# Patient Record
Sex: Female | Born: 1980 | Race: Black or African American | Hispanic: No | Marital: Single | Smoking: Former smoker
Health system: Southern US, Community
[De-identification: ages and names within clinical notes are randomized; demographics above are authoritative.]

## PROBLEM LIST (undated history)

## (undated) DIAGNOSIS — E669 Obesity, unspecified: Secondary | ICD-10-CM

## (undated) DIAGNOSIS — D481 Neoplasm of uncertain behavior of connective and other soft tissue: Secondary | ICD-10-CM

## (undated) DIAGNOSIS — D649 Anemia, unspecified: Secondary | ICD-10-CM

## (undated) DIAGNOSIS — I10 Essential (primary) hypertension: Secondary | ICD-10-CM

## (undated) DIAGNOSIS — D071 Carcinoma in situ of vulva: Secondary | ICD-10-CM

## (undated) DIAGNOSIS — D48119 Desmoid tumor of unspecified site: Secondary | ICD-10-CM

## (undated) HISTORY — PX: TUBAL LIGATION: SHX77

## (undated) HISTORY — DX: Neoplasm of uncertain behavior of connective and other soft tissue: D48.1

## (undated) HISTORY — DX: Obesity, unspecified: E66.9

## (undated) HISTORY — DX: Carcinoma in situ of vulva: D07.1

## (undated) HISTORY — DX: Essential (primary) hypertension: I10

## (undated) HISTORY — DX: Desmoid tumor of unspecified site: D48.119

---

## 2012-05-13 HISTORY — PX: SOFT TISSUE TUMOR RESECTION: SHX1054

## 2017-10-09 ENCOUNTER — Emergency Department (HOSPITAL_COMMUNITY)
Admission: EM | Admit: 2017-10-09 | Discharge: 2017-10-09 | Disposition: A | Discharge status: Home / Self Care | Payer: Medicaid Other | Attending: Emergency Medicine | Admitting: Emergency Medicine

## 2017-10-09 ENCOUNTER — Emergency Department (HOSPITAL_COMMUNITY): Payer: Medicaid Other

## 2017-10-09 ENCOUNTER — Ambulatory Visit (HOSPITAL_COMMUNITY): Admission: RE | Admit: 2017-10-09 | Payer: Medicaid Other | Source: Ambulatory Visit

## 2017-10-09 DIAGNOSIS — Y9301 Activity, walking, marching and hiking: Secondary | ICD-10-CM | POA: Insufficient documentation

## 2017-10-09 DIAGNOSIS — X509XXA Other and unspecified overexertion or strenuous movements or postures, initial encounter: Secondary | ICD-10-CM | POA: Insufficient documentation

## 2017-10-09 DIAGNOSIS — S99921A Unspecified injury of right foot, initial encounter: Secondary | ICD-10-CM | POA: Diagnosis present

## 2017-10-09 DIAGNOSIS — Y9289 Other specified places as the place of occurrence of the external cause: Secondary | ICD-10-CM | POA: Insufficient documentation

## 2017-10-09 DIAGNOSIS — S92351A Displaced fracture of fifth metatarsal bone, right foot, initial encounter for closed fracture: Secondary | ICD-10-CM | POA: Diagnosis not present

## 2017-10-09 DIAGNOSIS — Y998 Other external cause status: Secondary | ICD-10-CM | POA: Insufficient documentation

## 2017-10-09 MED ORDER — MELOXICAM 15 MG PO TABS: 15.0000 mg | ORAL_TABLET | Freq: Every day | ORAL | 0 refills | Status: DC

## 2017-10-09 NOTE — ED Provider Notes (Signed)
[facility] DEPT Provider Note   CSN: [id number] Arrival date & time: 10/09/17  1047     History   Chief Complaint Chief Complaint  Patient presents with  . Foot Pain    HPI Tamara Rhodes is a 37 y.o. female.   Tamara Rhodes is a 37 y.o. female who sustained a right foot injury 3 hour(s) ago. Mechanism of injury: Patient stepped wrong coming off of a step. Immediate symptoms: immediate pain, immediate swelling, was able to bear weight directly after injury, however heel walking on the R. Symptoms have been constant since that time. Prior history of related problems: no prior problems with this area in the past. .   The history is provided by the patient.  Foot Injury   The incident occurred 3 to 5 hours ago. The incident occurred at home. The injury mechanism was a fall. The pain is present in the right foot and right ankle. The quality of the pain is described as throbbing and aching. The pain is at a severity of 9/10. The pain is moderate. The pain has been constant since onset. Pertinent negatives include no numbness, no inability to bear weight, no loss of motion, no muscle weakness, no loss of sensation and no tingling. She reports no foreign bodies present. The symptoms are aggravated by bearing weight. She has tried nothing for the symptoms.    No past medical history on file.  There are no active problems to display for this patient.    The histories are not reviewed yet. Please review them in the "History" navigator section and refresh this [facility].   OB History   None      Home Medications    Prior to Admission medications   Medication Sig Start Date End Date Taking? Authorizing Provider  meloxicam (MOBIC) 15 MG tablet Take 1 tablet (15 mg total) by mouth daily. Take 1 daily with food. 10/09/17   Margarita Mail, PA-C    Family History No family history on file.  Social History Social History   Tobacco Use  . Smoking  status: Not on file  Substance Use Topics  . Alcohol use: Not on file  . Drug use: Not on file     Allergies   Patient has no allergy information on record.   Review of Systems Review of Systems  Musculoskeletal: Positive for gait problem and joint swelling.  Skin: Negative for wound.  Neurological: Negative for tingling, weakness and numbness.     Physical Exam Updated Vital Signs BP (!) 133/109 (BP Location: Left Arm)   Pulse 99   Temp 98.1 F (36.7 C) (Oral)   Resp 16   Ht 5' (1.524 m)   Wt 107 kg (236 lb)   SpO2 100%   BMI 46.09 kg/m   Physical Exam  Constitutional: She is oriented to person, place, and time. She appears well-developed and well-nourished. No distress.  HENT:  Head: Normocephalic and atraumatic.  Eyes: Conjunctivae are normal. No scleral icterus.  Neck: Normal range of motion.  Cardiovascular: Normal rate, regular rhythm and normal heart sounds. Exam reveals no gallop and no friction rub.  No murmur heard. Pulmonary/Chest: Effort normal and breath sounds normal. No respiratory distress.  Abdominal: Soft. Bowel sounds are normal. She exhibits no distension and no mass. There is no tenderness. There is no guarding.  Musculoskeletal:  R foot with swelling and brusing over the 4t/5th distal metatarsals of the R foot. Normal ipsilateral ankle and knee exam. NVI  Neurological: She is alert and oriented to person, place, and time.  Skin: Skin is warm and dry. She is not diaphoretic.  Psychiatric: Her behavior is normal.  Nursing note and vitals reviewed.    ED Treatments / Results  Labs (all labs ordered are listed, but only abnormal results are displayed) Labs Reviewed - No data to display  EKG None  Radiology Dg Foot Complete Right  Result Date: 10/09/2017 CLINICAL DATA:  37 year old female with 5th metatarsal region pain after slip and fall down stairs today. EXAM: RIGHT FOOT COMPLETE - 3+ VIEW COMPARISON:  None. FINDINGS: Spiral  extra-articular fracture of the distal right 5th metatarsal. 1/2 shaft width medial displacement and overriding of 4-5 millimeters. The 5th MTP joint appears normal. The 5th phalanges are intact. The other metatarsals appear intact; there is substantial foreshortening of the 4th metatarsal. No other fracture or dislocation identified. Joint spaces are within normal limits. Calcaneus intact. IMPRESSION: Extra-articular spiral fracture of the distal right 5th metatarsal with mild medial displacement and overriding. Electronically Signed   By: Genevie Ann M.D.   On: 10/09/2017 11:36    Procedures Procedures (including critical care time)  Medications Ordered in ED Medications - No data to display   Initial Impression / Assessment and Plan / ED Course  I have reviewed the triage vital signs and the nursing notes.  Pertinent labs & imaging results that were available during my care of the patient were reviewed by me and considered in my medical decision making (see chart for details).     Patient with a closed displaced spiral fracture of the distal fifth metatarsal.  I reviewed the images with Dr. Zollie Beckers who states that she can follow-up in his office next week, weightbearing as tolerated with a cam walker.  Informed of diagnosis and follow-up plan.  She appears appropriate for discharge at this time.  Final Clinical Impressions(s) / ED Diagnoses   Final diagnoses:  Closed displaced fracture of fifth metatarsal bone of right foot, initial encounter    ED Discharge Orders        Ordered    meloxicam (MOBIC) 15 MG tablet  Daily     10/09/17 1355       Margarita Mail, PA-C 10/09/17 1357    Tanna Furry, MD 10/11/17 [phone]

## 2017-10-09 NOTE — Discharge Instructions (Signed)
Contact a health care provider if: You have a fever. Your cast, splint, or boot is too loose or too tight. Your cast, splint, or boot is damaged. Your pain medicine is not helping. You have pain, tingling, or numbness in your foot that is not going away. Get help right away if: You have severe pain. You have tingling or numbness in your foot that is getting worse. Your foot feels cold or becomes numb. Your foot changes color.

## 2017-10-09 NOTE — ED Triage Notes (Signed)
Pt tripped and fell this morning, injuring right foot. Pt complains pt complains of pain on top of foot and along 4th and 5th toes.

## 2017-10-14 ENCOUNTER — Ambulatory Visit (INDEPENDENT_AMBULATORY_CARE_PROVIDER_SITE_OTHER): Payer: Medicaid Other | Admitting: Orthopaedic Surgery

## 2017-10-14 DIAGNOSIS — S92351A Displaced fracture of fifth metatarsal bone, right foot, initial encounter for closed fracture: Secondary | ICD-10-CM

## 2017-10-14 MED ORDER — IBUPROFEN 800 MG PO TABS: 800.0000 mg | ORAL_TABLET | Freq: Two times a day (BID) | ORAL | 0 refills | Status: DC | PRN

## 2017-10-14 MED ORDER — VITAMIN D (ERGOCALCIFEROL) 1.25 MG (50000 UNIT) PO CAPS: 50000.0000 [IU] | ORAL_CAPSULE | ORAL | 0 refills | Status: DC

## 2017-10-14 NOTE — Progress Notes (Signed)
   Office Visit Note   Patient: Tamara Rhodes           Date of Birth: 03-24-1981           MRN: [id number] Visit Date: 10/14/2017              Requested by: No referring provider defined for this encounter. PCP: Patient, No Pcp Per   Assessment & Plan: Visit Diagnoses:  1. Closed displaced fracture of fifth metatarsal bone of right foot, initial encounter     Plan: At this point, we will transition the patient into a postoperative shoe weightbearing as tolerated.  She will rest, ice and elevate as much as possible over the next few weeks.  I have also called in ibuprofen as well as vitamin D.  She will follow-up with [place] in 3 weeks time for repeat evaluation and x-ray.  Call with concerns or questions in the meantime.  Follow-Up Instructions: Return in about 3 weeks (around 11/04/2017).   Orders:  No orders of the defined types were placed in this encounter.  Meds ordered this encounter  Medications  . ibuprofen (ADVIL,MOTRIN) 800 MG tablet    Sig: Take 1 tablet (800 mg total) by mouth 2 (two) times daily as needed.    Dispense:  60 tablet    Refill:  0  . Vitamin D, Ergocalciferol, (DRISDOL) 50000 units CAPS capsule    Sig: Take 1 capsule (50,000 Units total) by mouth every 7 (seven) days.    Dispense:  6 capsule    Refill:  0      Procedures: No procedures performed   Clinical Data: No additional findings.   Subjective: Chief Complaint  Patient presents with  . Right Foot - Pain    HPI patient is a pleasant 37 year old female who presents to our clinic today with an acute injury to her right foot.  On 10/09/2017 she was going down a set of stairs when she likely inverted her right ankle falling to the ground.  She was seen in the ED where x-rays were obtained.  These showed a spiral fracture to the distal fifth metatarsal.  She was placed in a cam walker weightbearing as tolerated.  She has been taking over-the-counter medication with moderate relief of symptoms.   Overall doing much better.  She comes in today for further evaluation and treatment recommendations.  Review of Systems as detailed in HPI.  All others reviewed and are negative.   Objective: Vital Signs: There were no vitals taken for this visit.  Physical Exam well-developed well-nourished female no acute distress.  Alert and oriented x3.  Ortho Exam examination of her right foot reveals mild swelling.  No ecchymosis.  Moderate tenderness to the fifth metatarsal shaft.  She is neurovascularly intact distally.  Specialty Comments:  No specialty comments available.  Imaging: No new imaging today   PMFS History: Patient Active Problem List   Diagnosis Date Noted  . Closed displaced fracture of fifth metatarsal bone of right foot 10/14/2017   No past medical history on file.  No family history on file.   Social History   Occupational History  . Not on file  Tobacco Use  . Smoking status: Not on file  Substance and Sexual Activity  . Alcohol use: Not on file  . Drug use: Not on file  . Sexual activity: Not on file

## 2017-10-17 ENCOUNTER — Telehealth (INDEPENDENT_AMBULATORY_CARE_PROVIDER_SITE_OTHER): Payer: Self-pay | Admitting: Orthopaedic Surgery

## 2017-10-17 NOTE — Telephone Encounter (Signed)
Is this ok/appropriate?

## 2017-10-17 NOTE — Telephone Encounter (Signed)
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 She just needs it until her right foot is better. Please advise # [phone]

## 2017-10-17 NOTE — Telephone Encounter (Signed)
Yes 3 months

## 2017-10-17 NOTE — Telephone Encounter (Signed)
Patient aware this will be at the front desk Tuesday morning for her

## 2017-11-04 ENCOUNTER — Ambulatory Visit (INDEPENDENT_AMBULATORY_CARE_PROVIDER_SITE_OTHER): Payer: Medicaid Other | Admitting: Orthopaedic Surgery

## 2017-11-06 ENCOUNTER — Ambulatory Visit (INDEPENDENT_AMBULATORY_CARE_PROVIDER_SITE_OTHER): Payer: Medicaid Other | Admitting: Orthopaedic Surgery

## 2017-11-06 ENCOUNTER — Ambulatory Visit (INDEPENDENT_AMBULATORY_CARE_PROVIDER_SITE_OTHER): Payer: Self-pay

## 2017-11-06 DIAGNOSIS — S92351A Displaced fracture of fifth metatarsal bone, right foot, initial encounter for closed fracture: Secondary | ICD-10-CM

## 2017-11-06 NOTE — Progress Notes (Signed)
   Post-Op Visit Note   Patient: Tamara Rhodes           Date of Birth: December 07, 1980           MRN: [id number] Visit Date: 11/06/2017 PCP: Patient, No Pcp Per   Assessment & Plan:  Chief Complaint:  Chief Complaint  Patient presents with  . Right Foot - Follow-up    5th metatarsal fracture 10/09/17   Visit Diagnoses:  1. Closed displaced fracture of fifth metatarsal bone of right foot, initial encounter     Plan: Patient is approximately 4 weeks status post minimally displaced fifth metatarsal fracture.  She is feeling significantly better.  Assessment pain in the morning but overall she has a regular shoes.  X-rays demonstrate evidence of healing callus formation.  We will have her continue to weight-bear as tolerated and any appropriate shoes that she feels comfortable.  We will recheck her in 6 weeks with three-view x-rays of the right foot.  Follow-Up Instructions: Return in about 6 weeks (around 12/18/2017).   Orders:  Orders Placed This Encounter  Procedures  . XR Foot Complete Right   No orders of the defined types were placed in this encounter.   Imaging: Xr Foot Complete Right  Result Date: 11/06/2017 Stable alignment fracture with evidence of callus formation.     PMFS History: Patient Active Problem List   Diagnosis Date Noted  . Closed displaced fracture of fifth metatarsal bone of right foot 10/14/2017   No past medical history on file.  No family history on file.   Social History   Occupational History  . Not on file  Tobacco Use  . Smoking status: Not on file  Substance and Sexual Activity  . Alcohol use: Not on file  . Drug use: Not on file  . Sexual activity: Not on file

## 2017-12-18 ENCOUNTER — Ambulatory Visit (INDEPENDENT_AMBULATORY_CARE_PROVIDER_SITE_OTHER): Payer: Medicaid Other

## 2017-12-18 ENCOUNTER — Ambulatory Visit (INDEPENDENT_AMBULATORY_CARE_PROVIDER_SITE_OTHER): Payer: Medicaid Other | Admitting: Physician Assistant

## 2017-12-18 ENCOUNTER — Encounter (INDEPENDENT_AMBULATORY_CARE_PROVIDER_SITE_OTHER): Payer: Self-pay | Admitting: Physician Assistant

## 2017-12-18 DIAGNOSIS — M79671 Pain in right foot: Secondary | ICD-10-CM | POA: Diagnosis not present

## 2017-12-18 NOTE — Progress Notes (Signed)
   Post-Op Visit Note   Patient: Tamara Rhodes           Date of Birth: [birth year]           MRN: [id number] Visit Date: 12/18/2017 PCP: Patient, No Pcp Per   Assessment & Plan:  Chief Complaint:  Chief Complaint  Patient presents with  . Right Foot - Pain   Visit Diagnoses:  1. Pain in right foot     Plan: Patient is a pleasant 37 year old female who presents to our clinic today approximately 10 weeks status post right foot displaced fifth metatarsal shaft fracture.  She has transitioned into a regular shoe weightbearing as tolerated.  Having no pain and no swelling.  Overall doing excellent.  Examination of her right foot reveals no tenderness over the fracture site.  No swelling.  Full range of motion.  She is neurovascular intact distally.  At this point I believe she is clinically healed and we will let her advance with activity as tolerated.  I did tell her to be careful on uneven grounds as well as to avoid ballistic activities over the next 3 to 4 weeks.  She will follow-up with [place] as needed.  Call with concerns or questions in the meantime.  Follow-Up Instructions: Return if symptoms worsen or fail to improve.   Orders:  Orders Placed This Encounter  Procedures  . XR Foot Complete Right   No orders of the defined types were placed in this encounter.   Imaging: Xr Foot Complete Right  Result Date: 12/18/2017 Stable alignment of the fracture with great callus formation   PMFS History: Patient Active Problem List   Diagnosis Date Noted  . Pain in right foot 12/18/2017  . Closed displaced fracture of fifth metatarsal bone of right foot 10/14/2017   History reviewed. No pertinent past medical history.  History reviewed. No pertinent family history.  History reviewed. No pertinent surgical history. Social History   Occupational History  . Not on file  Tobacco Use  . Smoking status: Never Smoker  . Smokeless tobacco: Never Used  Substance and Sexual Activity  .  Alcohol use: Not on file  . Drug use: Not on file  . Sexual activity: Not on file

## 2017-12-19 ENCOUNTER — Ambulatory Visit (INDEPENDENT_AMBULATORY_CARE_PROVIDER_SITE_OTHER): Payer: Medicaid Other | Admitting: Orthopaedic Surgery

## 2020-03-06 ENCOUNTER — Ambulatory Visit (INDEPENDENT_AMBULATORY_CARE_PROVIDER_SITE_OTHER): Payer: Medicaid Other | Admitting: Obstetrics and Gynecology

## 2020-03-06 ENCOUNTER — Encounter: Payer: Self-pay | Admitting: Obstetrics and Gynecology

## 2020-03-06 ENCOUNTER — Other Ambulatory Visit (HOSPITAL_COMMUNITY)
Admission: RE | Admit: 2020-03-06 | Discharge: 2020-03-06 | Disposition: A | Discharge status: Home / Self Care | Payer: Medicaid Other | Source: Ambulatory Visit | Attending: Obstetrics and Gynecology | Admitting: Obstetrics and Gynecology

## 2020-03-06 ENCOUNTER — Other Ambulatory Visit: Payer: Self-pay

## 2020-03-06 DIAGNOSIS — N920 Excessive and frequent menstruation with regular cycle: Secondary | ICD-10-CM

## 2020-03-06 NOTE — Patient Instructions (Signed)
Endometrial Biopsy  Endometrial biopsy is a procedure in which a tissue sample is taken from inside the uterus. The sample is taken from the endometrium, which is the lining of the uterus. The tissue sample is then checked under a microscope to see if the tissue is normal or abnormal. This procedure helps to determine where you are in your menstrual cycle and how hormone levels are affecting the lining of the uterus. This procedure may also be used to evaluate uterine bleeding or to diagnose endometrial cancer, endometrial tuberculosis, polyps, or other inflammatory conditions. Tell a health care provider about:  Any allergies you have.  All medicines you are taking, including vitamins, herbs, eye drops, creams, and over-the-counter medicines.  Any problems you or family members have had with anesthetic medicines.  Any blood disorders you have.  Any surgeries you have had.  Any medical conditions you have.  Whether you are pregnant or may be pregnant. What are the risks? Generally, this is a safe procedure. However, problems may occur, including:  Bleeding.  Pelvic infection.  Puncture of the wall of the uterus with the biopsy device (rare). What happens before the procedure?  Keep a record of your menstrual cycles as told by your health care provider. You may need to schedule your procedure for a specific time in your cycle.  You may want to bring a sanitary pad to wear after the procedure.  Ask your health care provider about: ? Changing or stopping your regular medicines. This is especially important if you are taking diabetes medicines or blood thinners. ? Taking medicines such as aspirin and ibuprofen. These medicines can thin your blood. Do not take these medicines before your procedure if your health care provider instructs you not to.  Plan to have someone take you home from the hospital or clinic. What happens during the procedure?  To lower your risk of  infection: ? Your health care team will wash or sanitize their hands.  You will lie on an exam table with your feet and legs supported as in a pelvic exam.  Your health care provider will insert an instrument (speculum) into your vagina to see your cervix.  Your cervix will be cleansed with an antiseptic solution.  A medicine (local anesthetic) will be used to numb the cervix.  A forceps instrument (tenaculum) will be used to hold your cervix steady for the biopsy.  A thin, rod-like instrument (uterine sound) will be inserted through your cervix to determine the length of your uterus and the location where the biopsy sample will be removed.  A thin, flexible tube (catheter) will be inserted through your cervix and into the uterus. The catheter will be used to collect the biopsy sample from your endometrial tissue.  The catheter and speculum will then be removed, and the tissue sample will be sent to a lab for examination. What happens after the procedure?  You will rest in a recovery area until you are ready to go home.  You may have mild cramping and a small amount of vaginal bleeding. This is normal.  It is up to you to get the results of your procedure. Ask your health care provider, or the department that is doing the procedure, when your results will be ready. Summary  Endometrial biopsy is a procedure in which a tissue sample is taken from the endometrium, which is the lining of the uterus.  This procedure may help to diagnose menstrual cycle problems, abnormal bleeding, or other conditions affecting   the endometrium.  Before the procedure, keep a record of your menstrual cycles as told by your health care provider.  The tissue sample that is removed will be checked under a microscope to see if it is normal or abnormal. This information is not intended to replace advice given to you by your health care provider. Make sure you discuss any questions you have with your health care  provider. Document Revised: 04/11/2017 Document Reviewed: 05/15/2016 Elsevier Patient Education  [place]. Menorrhagia Menorrhagia is when your menstrual periods are heavy or last longer than normal. Follow these instructions at home: Medicines   Take over-the-counter and prescription medicines exactly as told by your doctor. This includes iron pills.  Do not change or switch medicines without asking your doctor.  Do not take aspirin or medicines that contain aspirin 1 week before or during your period. Aspirin may make bleeding worse. General instructions  If you need to change your pad or tampon more than once every 2 hours, limit your activity until the bleeding stops.  Iron pills can cause problems when pooping (constipation). To prevent or treat pooping problems while taking prescription iron pills, your doctor may suggest that you: ? Drink enough fluid to keep your pee (urine) clear or pale yellow. ? Take over-the-counter or prescription medicines. ? Eat foods that are high in fiber. These foods include:  Fresh fruits and vegetables.  Whole grains.  Beans. ? Limit foods that are high in fat and processed sugars. This includes fried and sweet foods.  Eat healthy meals and foods that are high in iron. Foods that have a lot of iron include: ? Leafy green vegetables. ? Meat. ? Liver. ? Eggs. ? Whole grain breads and cereals.  Do not try to lose weight until your heavy bleeding has stopped and you have normal amounts of iron in your blood. If you need to lose weight, work with your doctor.  Keep all follow-up visits as told by your doctor. This is important. Contact a doctor if:  You soak through a pad or tampon every 1 or 2 hours, and this happens every time you have a period.  You need to use pads and tampons at the same time because you are bleeding so much.  You are taking medicine and you: ? Feel sick to your stomach (nauseous). ? Throw up  (vomit). ? Have watery poop (diarrhea).  You have other problems that may be related to the medicine you are taking. Get help right away if:  You soak through more than a pad or tampon in 1 hour.  You pass clots bigger than 1 inch (2.5 cm) wide.  You feel short of breath.  You feel like your heart is beating too fast.  You feel dizzy or you pass out (faint).  You feel very weak or tired. Summary  Menorrhagia is when your menstrual periods are heavy or last longer than normal.  Take over-the-counter and prescription medicines exactly as told by your doctor. This includes iron pills.  Contact a doctor if you soak through more than a pad or tampon in 1 hour or are passing large clots. This information is not intended to replace advice given to you by your health care provider. Make sure you discuss any questions you have with your health care provider. Document Revised: 08/06/2017 Document Reviewed: 05/20/2016 Elsevier Patient Education  [place].

## 2020-03-06 NOTE — Progress Notes (Signed)
The older she gets the worse they are, cannot keep up with flow on 1st and 2nd day are the heaviest and have to use tampons and pads together will change at least hourly to 15-20 mins at times because of heaviness of flow. Monthly cycles.   Has white spot near vaginal area.

## 2020-03-06 NOTE — Progress Notes (Signed)
  CC: heavy menstrual bleeding Subjective:    Patient ID: Tamara Rhodes, female    DOB: Feb 06, 1981, 39 y.o.   MRN: [id number]  HPI 39 yo G3P2, c/s x 2, seen at [facility] for heavy menstrual bleeding.  Per pt her menses are regular.  Her cycles last 4-5 days.  She does pass clots during her menses.  The heavy bleeding has been present for about the last 2 years and has been getting worse.  On her heaviest day she can use 10-12 tampons.  The patient has used nothing for interventions.  The patient denies fatigue/syncope.  Pt has had a tubal ligation 16 years ago.  The patient is also concerned about a hypopigmented lesion.  The lesion is present on the vulva and is present.   Review of Systems  Constitutional: Negative.   HENT: Negative.   Eyes: Negative.   Respiratory: Negative.   Cardiovascular: Negative.   Gastrointestinal: Negative.   Endocrine: Negative.   Genitourinary: Positive for vaginal bleeding.  Musculoskeletal: Negative.   Allergic/Immunologic: Negative.   Neurological: Negative for dizziness and light-headedness.  Hematological: Negative.   Psychiatric/Behavioral: Negative.        Objective:   Physical Exam Constitutional:      General: She is not in acute distress.    Appearance: Normal appearance. She is obese.  HENT:     Head: Normocephalic and atraumatic.  Cardiovascular:     Rate and Rhythm: Normal rate and regular rhythm.     Heart sounds: Normal heart sounds.  Pulmonary:     Effort: Pulmonary effort is normal.     Breath sounds: Normal breath sounds.  Abdominal:     General: Abdomen is flat. There is no distension.     Palpations: Abdomen is soft. There is no mass.  Genitourinary:    Comments: SSE: attempted pap, cervix not well visualized due to body habitus and cervical position (anterior) Cervix appears to be flush with vaginal wall SVE: bimanual showed normal sized uterus, mobile, no obvious fibroids noted Skin:    General: Skin is warm and dry.    Neurological:     General: No focal deficit present.     Mental Status: She is alert and oriented to person, place, and time.  Psychiatric:        Mood and Affect: Mood normal.        Thought Content: Thought content normal.        Judgment: Judgment normal.           Assessment & Plan:   1. Menorrhagia with regular cycle Pt offered endometrial biopsy today, but she wanted to wait until next visit.  Pelvic u/s ordered to evaluate the uterus for any structural reasons for heavy bleeding. Pt advised to premedicate for biopsy procedure  Discussed treatment options including lysteda, OCP, progesterone IUD, uterine ablation or hysterectomy.  Of note, IUD or uterine ablation may be difficult due to anatomy.  Will know more after endometrial biopsy attempt.  Will also biopsy or remove vulvar lesion at next visit. - US PELVIC COMPLETE WITH TRANSVAGINAL; Future - Cytology - PAP( [facility])

## 2020-03-07 LAB — CYTOLOGY - PAP
Adequacy: ABSENT
Chlamydia: NEGATIVE
Comment: NEGATIVE
Comment: NEGATIVE
Comment: NORMAL
Diagnosis: NEGATIVE
High risk HPV: NEGATIVE
Neisseria Gonorrhea: NEGATIVE

## 2020-03-16 ENCOUNTER — Ambulatory Visit
Admission: RE | Admit: 2020-03-16 | Discharge: 2020-03-16 | Disposition: A | Discharge status: Home / Self Care | Payer: Medicaid Other | Source: Ambulatory Visit | Attending: Obstetrics and Gynecology | Admitting: Obstetrics and Gynecology

## 2020-03-16 ENCOUNTER — Other Ambulatory Visit: Payer: Self-pay

## 2020-03-16 DIAGNOSIS — N920 Excessive and frequent menstruation with regular cycle: Secondary | ICD-10-CM | POA: Insufficient documentation

## 2020-03-31 ENCOUNTER — Ambulatory Visit (INDEPENDENT_AMBULATORY_CARE_PROVIDER_SITE_OTHER): Payer: Medicaid Other | Admitting: Obstetrics and Gynecology

## 2020-03-31 ENCOUNTER — Other Ambulatory Visit (HOSPITAL_COMMUNITY)
Admission: RE | Admit: 2020-03-31 | Discharge: 2020-03-31 | Disposition: A | Discharge status: Home / Self Care | Payer: Medicaid Other | Source: Ambulatory Visit | Attending: Obstetrics and Gynecology | Admitting: Obstetrics and Gynecology

## 2020-03-31 ENCOUNTER — Other Ambulatory Visit: Payer: Self-pay

## 2020-03-31 ENCOUNTER — Encounter: Payer: Self-pay | Admitting: Obstetrics and Gynecology

## 2020-03-31 VITALS — BP 141/91 | HR 79 | Wt 270.0 lb

## 2020-03-31 DIAGNOSIS — N9089 Other specified noninflammatory disorders of vulva and perineum: Secondary | ICD-10-CM | POA: Diagnosis not present

## 2020-03-31 DIAGNOSIS — N920 Excessive and frequent menstruation with regular cycle: Secondary | ICD-10-CM

## 2020-03-31 DIAGNOSIS — Z3202 Encounter for pregnancy test, result negative: Secondary | ICD-10-CM | POA: Diagnosis not present

## 2020-03-31 LAB — POCT PREGNANCY, URINE: Preg Test, Ur: NEGATIVE

## 2020-03-31 MED ORDER — TRANEXAMIC ACID 650 MG PO TABS: 1300.0000 mg | ORAL_TABLET | Freq: Three times a day (TID) | ORAL | 4 refills | Status: AC

## 2020-03-31 NOTE — Patient Instructions (Signed)
Tranexamic acid oral tablets What is this medicine? TRANEXAMIC ACID (TRAN ex AM ik AS id) slows down or stops blood clots from being broken down. This medicine is used to treat heavy monthly menstrual bleeding. This medicine may be used for other purposes; ask your health care provider or pharmacist if you have questions. COMMON BRAND NAME(S): Cyklokapron, Lysteda What should I tell my health care provider before I take this medicine? They need to know if you have any of these conditions:  bleeding in the brain  blood clotting problems  kidney disease  vision problems  an unusual allergic reaction to tranexamic acid, other medicines, foods, dyes, or preservatives  pregnant or trying to get pregnant  breast-feeding How should I use this medicine? Take this medicine by mouth with a glass of water. Follow the directions on the prescription label. Do not cut, crush, or chew this medicine. You can take it with or without food. If it upsets your stomach, take it with food. Take your medicine at regular intervals. Do not take it more often than directed. Do not stop taking except on your doctor's advice. Do not take this medicine until your period has started. Do not take it for more than 5 days in a row. Do not take this medicine when you do not have your period. Talk to your pediatrician regarding the use of this medicine in children. While this drug may be prescribed for female children as young as 71 years of age for selected conditions, precautions do apply. Overdosage: If you think you have taken too much of this medicine contact a poison control center or emergency room at once. NOTE: This medicine is only for you. Do not share this medicine with others. What if I miss a dose? If you miss a dose, take it when you remember, and then take your next dose at least 6 hours later. Do not take more than 2 tablets at a time to make up for missed doses. What may interact with this medicine? Do  not take this medicine with any of the following medications:  estrogens  birth control pills, patches, injections, rings or other devices that contain both an estrogen and a progestin This medicine may also interact with the following medications:  certain medicines used to help your blood clot  tretinoin (taken by mouth) This list may not describe all possible interactions. Give your health care provider a list of all the medicines, herbs, non-prescription drugs, or dietary supplements you use. Also tell them if you smoke, drink alcohol, or use illegal drugs. Some items may interact with your medicine. What should I watch for while using this medicine? Tell your doctor or healthcare professional if your symptoms do not start to get better or if they get worse. Tell your doctor or healthcare professional if you notice any eye problems while taking this medicine. Your doctor will refer you to an eye doctor who will examine your eyes. What side effects may I notice from receiving this medicine? Side effects that you should report to your doctor or health care professional as soon as possible:  allergic reactions like skin rash, itching or hives, swelling of the face, lips, or tongue  breathing difficulties  changes in vision  sudden or severe pain in the chest, legs, head, or groin  unusually weak or tired Side effects that usually do not require medical attention (report to your doctor or health care professional if they continue or are bothersome):  back pain  headache  muscle or joint aches  sinus and nasal problems  stomach pain  tiredness This list may not describe all possible side effects. Call your doctor for medical advice about side effects. You may report side effects to FDA at 1-800-FDA-1088. Where should I keep my medicine? Keep out of the reach of children. Store at room temperature between 15 and 30 degrees C (59 and 86 degrees F). Throw away any unused  medicine after the expiration date. NOTE: This sheet is a summary. It may not cover all possible information. If you have questions about this medicine, talk to your doctor, pharmacist, or health care provider.  2020 Elsevier/Gold Standard (2015-06-01 09:12:15) Genital Warts Genital warts are small growths in the area around the genitals or the anus. They are caused by a type of germ (HPV virus). This germ is spread from person to person during sex. It can be spread through vaginal, anal, and oral sex. Genital warts can lead to other problems if they are not treated. A person is more likely to have this condition if he or she:  Has sex without using a condom.  Has sex with many people.  Has sex before the age of 56.  Has a weak body defense (immune) system. This condition can be treated with medicines. Your doctor may also burn or freeze the warts. In some cases, surgery may be done to remove the warts. Follow these instructions at home: Medicines   Apply over-the-counter and prescription medicines only as told by your doctor.  Do not use medicines that are meant for treating hand warts.  Talk with your doctor about using creams to treat itching. Instructions for women  Plan to have regular tests to check for cervical cancer. Your risk for this cancer increases when you have genital warts.  If you become pregnant, tell your doctor that you have had genital warts. The germ can be passed to the baby. General instructions  Do not touch or scratch the warts.  Do not have sex until your treatment is done.  Tell your current and past sexual partners about your condition. They may need treatment.  After treatment, use condoms during sex.  Keep all follow-up visits as told by your doctor. This is important. How is this prevented? Talk with your doctor about getting the HPV shot. The HPV shot:  Can help stop some HPV infections and cancers.  Is given to males and females who are  2-61 years old.  Will not work if you already have HPV.  Is not recommended for pregnant women. Contact a doctor if:  You have redness, swelling, or pain in the area of the treated skin.  You have a fever.  You feel sick.  You feel lumps in the area around your genitals or anus.  You have bleeding in the area around your genitals or anus.  You have pain during sex. Summary  Genital warts are small growths in the areas around the genitals or the anus. They are caused by a type of germ (HPV virus).  The germ is spread by having vaginal, anal, or oral sex without using a condom.  This condition is treated using medicines. In some cases, freezing, burning, or surgery may be done to get rid of the warts.  This condition may be prevented by getting a HPV shot. This information is not intended to replace advice given to you by your health care provider. Make sure you discuss any questions you have with your  health care provider. Document Revised: 06/03/2017 Document Reviewed: 06/03/2017 Elsevier Patient Education  2020 [employer].

## 2020-04-01 NOTE — Progress Notes (Signed)
ENDOMETRIAL BIOPSY      Tamara Rhodes is a 39 y.o. No obstetric history on file. here for endometrial biopsy.  The indications for endometrial biopsy were reviewed.  Risks of the biopsy including cramping, bleeding, infection, uterine perforation, inadequate specimen and need for additional procedures were discussed. The patient states she understands and agrees to undergo procedure today. Consent was signed. Time out was performed.   Indications: menorrhagia Urine SAY:[id number]  A bivalve speculum was placed into the vagina and the cervix was not easily visualized multiple attempts were made to visualize the cervix, but no cervical os was palpated or seen. The procedure was terminated for safety and attention was then turned to the vulvar biopsy.   VULVAR BIOPSY NOTE The indications for vulvar biopsy (rule out neoplasia, establish lichen sclerosus diagnosis) were reviewed.   Risks of the biopsy including pain, bleeding, infection, inadequate specimen, scarring and need for additional procedures  were discussed. The patient stated understanding and agreed to undergo procedure today. Consent was signed,  time out performed.  The patient's vulva was prepped with Betadine. 1% lidocaine was injected into the left vulva. A 8-mm punch biopsy was done, biopsy tissue was picked up with sterile forceps and sterile scissors were used to excise the lesion.  Small bleeding was noted and hemostasis was achieved using a figure of eight stitch of 2-0 vicryl.  A second biopsy of the hyperpigmented lesion was taken using a 6 mm punch biopsy.Hemostasis was achieved using silver nitrate.  The patient tolerated the procedure well. Post-procedure instructions  (pelvic rest for one week) were given to the patient. The patient is to call with heavy bleeding, fever greater than 100.4, foul smelling vaginal discharge or other concerns. The patient will be return to clinic in two weeks for discussion of results.  Regarding  the menorrhagia, pt is not a good candidate for IUD, ablation, or hysterectomy from a vaginal approach.  She will try lysteda to decrease bleeding and then follow up in 2 months for reevaluation.  If the medication is not effective, then may consider referral for robotic or total laparoscopic hysterectomy.  Tamara Shields, MD

## 2020-04-03 LAB — SURGICAL PATHOLOGY

## 2020-04-10 ENCOUNTER — Other Ambulatory Visit (INDEPENDENT_AMBULATORY_CARE_PROVIDER_SITE_OTHER): Payer: Medicaid Other | Admitting: Obstetrics and Gynecology

## 2020-04-10 DIAGNOSIS — D071 Carcinoma in situ of vulva: Secondary | ICD-10-CM

## 2020-04-10 NOTE — Progress Notes (Signed)
Referral to gyn onc.  I have already messaged Dr. Denman George and she states she will see her.

## 2020-04-13 ENCOUNTER — Telehealth: Payer: Self-pay | Admitting: *Deleted

## 2020-04-13 NOTE — Telephone Encounter (Signed)
-----   Message from Griffin Basil, MD sent at 04/10/2020  8:14 AM EST ----- Regarding: vulvar dysplasia Please schedule outpatient referral to gyn onc for further treatment.  I will place the order.  Thanks.

## 2020-04-13 NOTE — Telephone Encounter (Signed)
I called GYN Oncology and left a message I was calling about getting appointment for a referral we have placed. I left mrn # asked for a call back letting us know they have the referral and if they will call patient with appointment or if we need to. Javarion Douty,RN

## 2020-04-14 ENCOUNTER — Telehealth: Payer: Self-pay | Admitting: *Deleted

## 2020-04-14 NOTE — Telephone Encounter (Signed)
Called the patient and left a message to call the office back. Patient needs to be scheduled for a new patient appt.  

## 2020-04-17 NOTE — Telephone Encounter (Signed)
Patient scheduled for a new patient appt on 12/10

## 2020-04-18 NOTE — Telephone Encounter (Signed)
Per chart review pt has been scheduled for gyn/onc new pt appt 04/21/20 at 0900 and was contacted by NT at gyn/onc dept.

## 2020-04-20 ENCOUNTER — Telehealth: Payer: Self-pay

## 2020-04-20 NOTE — Telephone Encounter (Signed)
Called patient to review general intake information for her appointment tomorrow. 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  She will call the office back to reschedule as she works nights.

## 2020-04-20 NOTE — Progress Notes (Unsigned)
GYNECOLOGIC ONCOLOGY NEW PATIENT CONSULTATION   Patient Name: Tamara Rhodes  Patient Age: 39 y.o. Date of Service: 04/21/20 Referring Provider: Dr. Lynnda Shields  Primary Care Provider: Patient, No Pcp Per Consulting Provider: Jeral Pinch, MD   Assessment/Plan:  ***   A copy of this note was sent to the patient's referring provider.   Jeral Pinch, MD  Division of Gynecologic Oncology  Department of Obstetrics and Gynecology  [facility]  ___________________________________________  Chief Complaint: No chief complaint on file.   History of Present Illness:  Tamara Rhodes is a 39 y.o. y.o. female who is seen in consultation at the request of Dr. Elgie Congo for an evaluation of ***  39 yo G3P2, c/s x 2, seen at [facility] for heavy menstrual bleeding.  Per pt her menses are regular.  Her cycles last 4-5 days.  She does pass clots during her menses.  The heavy bleeding has been present for about the last 2 years and has been getting worse.  On her heaviest day she can use 10-12 tampons.  The patient has used nothing for interventions.  The patient denies fatigue/syncope.  Pt has had a tubal ligation 16 years ago.  The patient is also concerned about a hypopigmented lesion.  The lesion is present on the vulva and is present.  PAST MEDICAL HISTORY:  Past Medical History:  Diagnosis Date  . Desmoid fibromatosis   . Hypertension      PAST SURGICAL HISTORY:  Past Surgical History:  Procedure Laterality Date  . CESAREAN SECTION     pt had had two  . SOFT TISSUE TUMOR RESECTION  2014   from back  . TUBAL LIGATION     16 years ago with c section    OB/GYN HISTORY:  OB History  No obstetric history on file.    No LMP recorded.  Age at menarche: ***  Age at menopause: *** Hx of HRT: *** Hx of STDs: *** Last pap: 02/2020 NILM, HR HPV negative History of abnormal pap smears: no  SCREENING STUDIES:  Last mammogram: ***  Last colonoscopy:  *** Last bone mineral density: ***  MEDICATIONS: Outpatient Encounter Medications as of 04/21/2020  Medication Sig  . acetaminophen (TYLENOL) 325 MG tablet Take by mouth.  Marland Kitchen amLODipine (NORVASC) 5 MG tablet Take by mouth.  . cyclobenzaprine (FLEXERIL) 5 MG tablet TAKE 1 TABLET BY MOUTH THREE TIMES DAILY AS NEEDED FOR UP TO 10 DAYS FOR MUSCLE SPASMS  . hydrOXYzine (ATARAX/VISTARIL) 25 MG tablet   . ibuprofen (ADVIL,MOTRIN) 800 MG tablet Take 1 tablet (800 mg total) by mouth 2 (two) times daily as needed.  . tranexamic acid (LYSTEDA) 650 MG TABS tablet Take 2 tablets (1,300 mg total) by mouth 3 (three) times daily. Take during menses for a maximum of five days   No facility-administered encounter medications on file as of 04/21/2020.    ALLERGIES:  Allergies  Allergen Reactions  . Penicillins Other (See Comments)    Yeast infection     FAMILY HISTORY:  Family History  Problem Relation Age of Onset  . Osteoarthritis Mother   . Hypertension Sister   . Diabetes Father   . Hypertension Father   . Renal Disease Father      SOCIAL HISTORY:  Social Connections: Not on file    REVIEW OF SYSTEMS:  Denies appetite changes, fevers, chills, fatigue, unexplained weight changes. Denies hearing loss, neck lumps or masses, mouth sores, ringing in ears or voice changes. Denies cough  or wheezing.  Denies shortness of breath. Denies chest pain or palpitations. Denies leg swelling. Denies abdominal distention, pain, blood in stools, constipation, diarrhea, nausea, vomiting, or early satiety. Denies pain with intercourse, dysuria, frequency, hematuria or incontinence. Denies hot flashes, pelvic pain, vaginal bleeding or vaginal discharge.   Denies joint pain, back pain or muscle pain/cramps. Denies itching, rash, or wounds. Denies dizziness, headaches, numbness or seizures. Denies swollen lymph nodes or glands, denies easy bruising or bleeding. Denies anxiety, depression, confusion, or  decreased concentration.  Physical Exam:  Vital Signs for this encounter:  There were no vitals taken for this visit. There is no height or weight on file to calculate BMI. General: Alert, oriented, no acute distress.  HEENT: Normocephalic, atraumatic. Sclera anicteric.  Chest: Clear to auscultation bilaterally. No wheezes, rhonchi, or rales. Cardiovascular: Regular rate and rhythm, no murmurs, rubs, or gallops.  Abdomen: ***Obese. Normoactive bowel sounds. Soft, nondistended, nontender to palpation. No masses or hepatosplenomegaly appreciated. No palpable fluid wave.  Extremities: Grossly normal range of motion. Warm, well perfused. No edema bilaterally.  Skin: No rashes or lesions.  Lymphatics: No cervical, supraclavicular, or inguinal adenopathy.  GU:  Normal external female genitalia. ***  No lesions. No discharge or bleeding.             Bladder/urethra:  No lesions or masses, well supported bladder             Vagina: ***             Cervix: Normal appearing, no lesions.             Uterus: *** Small, mobile, no parametrial involvement or nodularity.             Adnexa: *** masses.  Rectal: ***  LABORATORY AND RADIOLOGIC DATA:  No results found for: WBC, HGB, HCT, PLT, GLUCOSE, CHOL, TRIG, HDL, LDLDIRECT, LDLCALC, ALT, AST, NA, K, CL, CREATININE, BUN, CO2, TSH, PSA, INR, GLUF, HGBA1C, MICROALBUR  Pathology 11/19: A. VULVA, LEFT, BIOPSY:  - High grade squamous intraepithelial lesion, VIN-III (severe  dysplasia/CIS).   B. VULVA, LEFT, BIOPSY:  - High grade squamous intraepithelial lesion, VIN-III (severe  dysplasia/CIS).    Pelvic ultrasound on 11/4: Uterus  Measurements: 14.7 x 4.6 x 8.3 cm = volume: 298 mL. Retroflexed. Mildly heterogeneous echogenicity. No definite evidence of uterine mass, though assessment of the uterus is suboptimal due to uterine position and body habitus.  Endometrium  Thickness: 3 mm on transabdominal imaging, though suboptimally seen. No  obvious endometrial mass or fluid on limited visualization. Inadequately visualized on transvaginal imaging due to uterine position.  Right ovary  Measurements: 2.8 x 1.5 x 2.3 cm = volume: 4.8 mL. Normal morphology without mass  Left ovary  Measurements: 3.0 x 1.5 x 1.8 cm = volume: 4.3 mL. Normal morphology without mass  Other findings  No free pelvic fluid.  No adnexal masses.  IMPRESSION: Suboptimal exam due to body habitus and uterine position.  No definite pelvic sonographic abnormalities identified with limitations of exam as above.

## 2020-04-21 ENCOUNTER — Inpatient Hospital Stay: Payer: Medicaid Other | Admitting: Gynecologic Oncology

## 2020-04-24 ENCOUNTER — Telehealth: Payer: Self-pay | Admitting: *Deleted

## 2020-04-24 NOTE — Telephone Encounter (Signed)
Pt left VM message stating that she has questions about test results

## 2020-04-25 NOTE — Telephone Encounter (Signed)
Returned patients call. She did not answer. LM for her to call the office at her convenience with her concerns.

## 2020-05-11 ENCOUNTER — Telehealth: Payer: Self-pay

## 2020-05-11 ENCOUNTER — Telehealth: Payer: Self-pay | Admitting: *Deleted

## 2020-05-11 NOTE — Telephone Encounter (Signed)
R/S to 05-19-20. Reviewed new patient instructions as well as the address and phone number to the office. Pt verbalized understanding.

## 2020-05-11 NOTE — Telephone Encounter (Signed)
Pt left VM message requesting to speak with Dr. Donavan Foil' nurse. I returned pt's call and discussed her concerns. She stated that she had a recent procedure in our office which had abnormal results. She is requesting more information about the abnormality. Per chart review, pt had appt @ [facility] on 12/10 and cancelled. She stated that this was due to a work conflict. I advised pt that it would be best if she could reschedule the appointment as she will get additional information and education from the specialist. Pt voiced understanding and stated that she will resche[facility] the appointment [facility].

## 2020-05-18 ENCOUNTER — Encounter: Payer: Self-pay | Admitting: Gynecologic Oncology

## 2020-05-19 ENCOUNTER — Telehealth: Payer: Self-pay

## 2020-05-19 ENCOUNTER — Inpatient Hospital Stay: Payer: Medicaid Other | Admitting: Gynecologic Oncology

## 2020-05-19 DIAGNOSIS — D071 Carcinoma in situ of vulva: Secondary | ICD-10-CM

## 2020-05-19 NOTE — Telephone Encounter (Signed)
Tamara Rhodes states that she has some covid symptoms.  She was tested at work a couple of days ago and hopefully will get results today. Informed her that if she is positive that she has to wait 21 days to come into the cancer center since there are many immunocompromised patients.  Pt verbalized understanding. Pt will call back to our office to reschedule her new patient appointment with Dr. Berline Lopes once she knows the results of her covid test.

## 2020-06-06 ENCOUNTER — Telehealth: Payer: Self-pay | Admitting: *Deleted

## 2020-06-06 NOTE — Telephone Encounter (Signed)
Patient called and rescheduled appt since she was COIVD + on 1/7. Patient also requested a Thursday or Friday appt

## 2020-06-22 ENCOUNTER — Encounter: Payer: Self-pay | Admitting: Gynecologic Oncology

## 2020-06-23 ENCOUNTER — Inpatient Hospital Stay: Payer: Medicaid Other | Attending: Gynecologic Oncology | Admitting: Gynecologic Oncology

## 2020-06-23 ENCOUNTER — Other Ambulatory Visit: Payer: Self-pay

## 2020-06-23 ENCOUNTER — Encounter: Payer: Self-pay | Admitting: Gynecologic Oncology

## 2020-06-23 VITALS — BP 175/90 | HR 78 | Temp 96.8°F | Resp 18 | Ht 60.0 in | Wt 266.0 lb

## 2020-06-23 DIAGNOSIS — D071 Carcinoma in situ of vulva: Secondary | ICD-10-CM | POA: Insufficient documentation

## 2020-06-23 DIAGNOSIS — M728 Other fibroblastic disorders: Secondary | ICD-10-CM | POA: Diagnosis not present

## 2020-06-23 DIAGNOSIS — Z79899 Other long term (current) drug therapy: Secondary | ICD-10-CM | POA: Insufficient documentation

## 2020-06-23 DIAGNOSIS — I10 Essential (primary) hypertension: Secondary | ICD-10-CM | POA: Diagnosis not present

## 2020-06-23 DIAGNOSIS — Z9079 Acquired absence of other genital organ(s): Secondary | ICD-10-CM | POA: Insufficient documentation

## 2020-06-23 DIAGNOSIS — E669 Obesity, unspecified: Secondary | ICD-10-CM | POA: Diagnosis not present

## 2020-06-23 DIAGNOSIS — N939 Abnormal uterine and vaginal bleeding, unspecified: Secondary | ICD-10-CM | POA: Insufficient documentation

## 2020-06-23 NOTE — Patient Instructions (Addendum)
Preparing for your Surgery  Plan for surgery on June 29, 2020 with Dr. Jeral Pinch at [facility]. You will be scheduled for a wide local excision (also referred to as vulvectomy) of the vulva, biopsy of the lining of your uterus and Mirena IUD insertion.   Pre-operative Testing -You will receive a phone call from presurgical testing at [facility] to discuss pre-operative instructions including when to arrive/any meds to take, labs if needed, and COVID test. The COVID test normally happens 3 days prior to the surgery and they ask that you self quarantine after the test up until surgery to decrease chance of exposure.  -Bring your insurance card, copy of an advanced directive if applicable, medication list  -You should not be taking blood thinners or aspirin at least ten days prior to surgery unless instructed by your surgeon.  -Do not take supplements such as fish oil (omega 3), red yeast rice, turmeric before your surgery. You want to avoid medications with aspirin in them including headache powders such as BC or Goody's), Excedrin migraine.  Day Before Surgery at [facility] will be advised you can have clear liquids up until 3 hours before your surgery.    Your role in recovery Your role is to become active as soon as directed by your doctor, while still giving yourself time to heal.  Rest when you feel tired. You will be asked to do the following in order to speed your recovery:  - Cough and breathe deeply. This helps to clear and expand your lungs and can prevent pneumonia after surgery.  - Rock. Do mild physical activity. Walking or moving your legs help your circulation and body functions return to normal. Do not try to get up or walk alone the first time after surgery.   -If you develop swelling on one leg or the other, pain in the back of your leg, redness/warmth in one of your legs, please call the office or go  to the Emergency Room to have a doppler to rule out a blood clot. For shortness of breath, chest pain-seek care in the Emergency Room as soon as possible. - Actively manage your pain. Managing your pain lets you move in comfort. We will ask you to rate your pain on a scale of zero to 10. It is your responsibility to tell your doctor or nurse where and how much you hurt so your pain can be treated.  Special Considerations  -Your final pathology results from surgery should be available around one week after surgery and the results will be relayed to you when available.  -FMLA forms can be faxed to [phone] and please allow 5-7 business days for completion.  Pain Management After Surgery -You will be prescribed your pain medication and bowel regimen medications before surgery so that you can have these available when you are discharged from the hospital. The pain medication is for use ONLY AFTER surgery and a new prescription will not be given.   -Make sure that you have Tylenol and Ibuprofen at home to use on a regular basis after surgery for pain control. We recommend alternating the medications every hour to six hours since they work differently and are processed in the body differently for pain relief.  -Review the attached handout on narcotic use and their risks and side effects.   Bowel Regimen -You will be prescribed Sennakot-S to take nightly to prevent constipation especially if you are  taking the narcotic pain medication intermittently.  It is important to prevent constipation and drink adequate amounts of liquids. You can stop taking this medication when you are not taking pain medication and you are back on your normal bowel routine.  Risks of Surgery Risks of surgery are low but include bleeding, infection, damage to surrounding structures, re-operation, blood clots, and very rarely death.   AFTER SURGERY INSTRUCTIONS  Return to work: 2-4 weeks if applicable  Activity: 1. Be  up and out of the bed during the day.  Take a nap if needed.  You may walk up steps but be careful and use the hand rail.  Stair climbing will tire you more than you think, you may need to stop part way and rest.   2. No lifting or straining for minimum 2 weeks over 10 pounds. No pushing, pulling, straining for minimum 2 weeks.  3. No driving for minimum of 48 hours after the procedure. Do not drive if you are taking narcotic pain medicine and make sure that your reaction time has returned.   4. You can shower as soon as the next day after surgery. Shower daily.  Use your regular soap and water (not directly on the incision) and pat your incision(s) dry afterwards; don't rub.    5. No sexual activity and nothing in the vagina for 4 weeks.  6. You may experience clear to pink drainage from your incision, which is normal.  If the drainage persists, increases, or changes color please call the office.  7. Take Tylenol or ibuprofen first for pain and only use narcotic pain medication for severe pain not relieved by the Tylenol or Ibuprofen.  Monitor your Tylenol intake to a max of 4,000 mg in a 24 hour period. You can alternate these medications after surgery.  Diet: 1. Low sodium Heart Healthy Diet is recommended but you are cleared to resume your normal (before surgery) diet after your procedure.  2. It is safe to use a laxative, such as Miralax or Colace, if you have difficulty moving your bowels. You will be prescribed Sennakot at bedtime every evening to keep bowel movements regular and to prevent constipation.    Wound Care: 1. Keep clean and dry.  Shower daily.  Reasons to call the Doctor:  Fever - Oral temperature greater than 100.4 degrees Fahrenheit  Foul-smelling vaginal discharge  Difficulty urinating  Nausea and vomiting  Increased pain at the site of the incision that is unrelieved with pain medicine.  Difficulty breathing with or without chest pain  New calf pain  especially if only on one side  Sudden, continuing increased vaginal bleeding with or without clots.   Contacts: For questions or concerns you should contact:  Dr. Jeral Pinch at [phone]  Joylene John, NP at [phone]  After Hours: call [phone] and have the GYN Oncologist paged/contacted (after 5 pm or on the weekends).  Messages sent via mychart are for non-urgent matters and are not responded to after hours so for urgent needs, please call the after hours number.   Vulvectomy, Care After  This sheet gives you information about how to care for yourself after your procedure. Your health care provider may also give you more specific instructions. If you have problems or questions, contact your health care provider. What can I expect after the procedure? After the procedure, it is common to have:  Vaginal pain.  Vaginal numbness.  Vaginal swelling.  Bloody vaginal discharge. Follow these instructions at home: Activity  Rest as told by your health care provider.  Do not lift, push, or pull more than 10 lb, or the limit that you are told, until your health care provider says that it is safe.  Avoid activities that take a lot of effort for as long as told by your health care provider. This includes any exercise.  Raise (elevate) your legs while sitting or lying down.  Avoid standing or sitting in one place for long periods of time.  Do not Shelbi Vaccaro your legs, especially when sitting, until your health care provider approves.  Return to your normal activities as told by your health care provider. Ask your health care provider what activities are safe for you.   Bathing  Do not take baths, swim, or use a hot tub until your health care provider approves. Ask your health care provider if you can take showers. You may only be allowed to take sponge baths.  After passing urine or a bowel movement, wipe yourself from front to back and clean your vaginal area using  a spray bottle.  If told by your health care provider, take a sitz bath to help with discomfort. This is a warm water bath you take while sitting down. ? Do this 3-4 times a day, or as often as told by your health care provider. ? The water should only come up to your hips and cover your buttocks. ? You may pat the area dry with a soft, clean towel. ? If needed, you may then gently dry the area with a hair dryer on a cool setting for 5-10 minutes. An enclosed box fan may also be used to gently dry the area.   Incision care  Follow instructions from your health care provider about how to take care of your incision areas. Make sure you: ? Leave stitches (sutures), skin glue, adhesive strips, or surgical clips in place. These skin closures may need to stay in place for 2 weeks or longer. If adhesive strip edges start to loosen and curl up, you may trim the loose edges. Do not remove adhesive strips completely unless your health care provider tells you to do that.  Check your incision areas every day for signs of infection. It may be helpful to use a handheld mirror to do this. Check for: ? Redness, swelling, or pain that has gotten worse. ? More fluid or blood. ? Warmth. ? Pus or a bad smell.  If you were sent home with a drain, take care of it as told by your health care provider.   Lifestyle  Do not douche or use tampons until your health care provider approves.  Do not have sex until your health care provider approves. Tell your health care provider if you have pain or numbness when you return to sexual activity.  Wear cotton underwear and comfortable, loose-fitting clothing. Medicines  Take over-the-counter and prescription medicines only as told by your health care provider.  Ask your health care provider if the medicine prescribed to you: ? Requires you to avoid driving or using heavy machinery. ? Can cause constipation. You may need to take these actions to prevent or treat  constipation:  Take over-the-counter or prescription medicines.  Eat foods that are high in fiber, such as beans, whole grains, and fresh fruits and vegetables.  Limit foods that are high in fat and processed sugars, such as fried or sweet foods. General instructions  Do not drive until your health care provider says that it is  safe.  Drink enough fluid to keep your urine pale yellow.  Keep all follow-up visits as told by your health care provider. This is important. Contact a health care provider if:  You have any of these problems in the incision area: ? More redness, swelling, or pain around the incision. ? More fluid or blood coming from the incision. ? Pus or a bad smell coming from the incision. ? The incision feels warm to the touch. ? The incision breaks open.  You have a fever.  You have painful or bloody urination.  You feel nauseous or you vomit.  You have diarrhea.  You develop constipation.  You develop a rash.  You feel dizzy or light-headed.  You have pain that does not get better with medicine. Get help right away if you:  Faint.  Have leg or chest pain.  Have abdominal pain.  Have shortness of breath. Summary  After the procedure, it is common to have some pain, numbness, or swelling in the vaginal area. It is also common to have some bloody vaginal discharge.  Do not lift, push, or pull more than 5-10 lb (2.3 kg), or the limit that you are told, or engage in activities that take a lot of effort until your health care provider approves.  Follow instructions from your health care provider about how to take care of your incision.  Keep all follow-up visits as told by your health care provider. This is important. This information is not intended to replace advice given to you by your health care provider. Make sure you discuss any questions you have with your health care provider. Document Revised: 05/26/2018 Document Reviewed:  05/27/2018 Elsevier Patient Education  2021 [employer].    IUD information  What is an intrauterine device? An intrauterine device (IUD) is a type of birth control. It is a small, T-shaped device that a doctor or nurse puts in your uterus by going through your vagina and cervix (figure 1). These devices are made of flexible plastic and have 2 thin plastic strings that hang out of the cervix. They are very small, a little more than 1 inch (2.5 cm) in width and length.  An IUD is one of the safest, most effective methods for preventing pregnancy. It is a good choice for people, including teens, who do not want to get pregnant for at least 1 year. An IUD can also be used to prevent pregnancy if it is put in within 5 days after you have unprotected sex. This is known as "emergency contraception."  You can also use IUDs for reasons other than birth control. For example, 1 type of IUD can be used to treat heavy, painful periods.  What are the different types of IUDs? There are 2 types of IUDs available in the [place]. One type releases copper, and the other type releases a progestin-type hormone.  ?Copper-containing IUD - There is only 1 copper-containing IUD. It is called Paragard (picture 1) and can stay in your uterus for 10 years, or longer for some people, to prevent pregnancy. Some people who use it get heavier or longer periods than they had before getting the IUD.  ?Progestin-releasing IUD - There are 4 progestin-releasing IUDs (brand names: Ludwig Lean, [place]) (figure 2). Depending on which of these you have, it can stay in your uterus for up to 6, 5, or 3 years. Many people who use progestin-releasing IUDs have lighter, less painful periods than they had before getting the  IUD. Some people stop getting a period at all, but this is not harmful and does not need to be treated. Regular periods return when the device is taken out, usually within a month or 2.  Other types  of IUDs are also available outside of the [place].  What are the benefits of using an IUD? The benefits of using an IUD include:  ?IUDs are very effective. Fewer than 1 in 100 people who use these devices get pregnant during the first year of using them.  ?You do not have to remember to do anything or take any birth control medicines on a regular basis.  ?IUDs have few side effects.  ?IUDs do not contain estrogen, a hormone that some people can't or don't want to take.  ?If you decide you want to get pregnant, you can have the IUD taken out.  ?If you use an IUD for several years, it costs less overall than many other types of birth control. That's because there are no costs after you have it inserted.  ?There is evidence that using an IUD lowers your risk of getting cervical cancer.  What are the downsides of an IUD? The downsides of an IUD include:  ?Unlike condoms, an IUD does not protect you against infections you can catch during sex, called "sexually transmitted infections" or "STIs." But you and your partner(s) can use condoms to prevent spreading infections.  ?There is a small chance the IUD will come out during your period. If this happens, you will need a new IUD. If you see your IUD in your underwear, on your pad, or in the toilet, call your doctor or nurse.  ?The initial cost is higher than the cost of other methods. But, there are no more costs after it is inserted.  ?Only a doctor or nurse can insert or remove an IUD.  You should not get an IUD if you recently had an infection that spread to your uterus and other nearby organs, called a "pelvic infection." STDs such as chlamydia and gonorrhea can cause pelvic infections.  Which type of IUD is best for me? Your nurse or doctor can help you choose the right IUD for you.  Paragard might be a good choice if you:  ?Want or need to avoid hormones.  ?Want to avoid big changes in your period, such as not having any  periods or bleeding or spotting when you might not expect it.  ?Want birth control for up to 10 years, or possibly more  Mirena, [place], [place], or [place] might be a good choice if you:  ?Have heavy, painful periods. These IUDs can make your periods lighter and less painful.  ?Have pelvic pain from a condition called "endometriosis." These IUDs might help reduce the pain.  ?Want birth control for up to 6 years, depending on which device you choose  Does it hurt to have an IUD put in? You will likely feel some discomfort and slight cramping after the nurse or doctor puts the IUD into your uterus. People who have never given birth often feel more discomfort than people who have. The cramps generally go away within a day. Over-the-counter pain medicines like ibuprofen (sample brand names: Advil, Motrin) or naproxen (sample brand name: Aleve) can help cramps go away faster.  After the IUD is in place, you should not be able to feel it.  Should I see a doctor or nurse? If you have an IUD, see your doctor or nurse right away  if:  ?You have bad pain in your lower belly  ?Your period is late or very different from normal  ?You cannot feel the string of the IUD or if the string seems shorter than usual  ?You think your IUD might have moved or fallen out  ?You had sex with someone who has or might have an STD, or you think you have an STD  ?You have an unexplained fever

## 2020-06-23 NOTE — Patient Instructions (Signed)
DUE TO COVID-19 ONLY ONE VISITOR IS ALLOWED TO COME WITH YOU AND STAY IN THE WAITING ROOM ONLY DURING PRE OP AND PROCEDURE DAY OF SURGERY. THE 1 VISITOR  MAY VISIT WITH YOU AFTER SURGERY IN YOUR PRIVATE ROOM DURING VISITING HOURS ONLY!  YOU NEED TO HAVE A COVID 19 TEST ON: 06/26/20 @ 11:10 AM, THIS TEST MUST BE DONE BEFORE SURGERY,  COVID TESTING SITE [place] [id number], IT IS ON THE RIGHT GOING OUT [address] APPROXIMATELY  2 MINUTES PAST [employer] ON THE RIGHT. ONCE YOUR COVID TEST IS COMPLETED,  PLEASE BEGIN THE QUARANTINE INSTRUCTIONS AS OUTLINED IN YOUR HANDOUT.                Tamara Rhodes    Your procedure is scheduled on: 06/29/20   Report to [facility] Main  Entrance   Report to admitting at: 1:30 PM    Call this number if you have problems the morning of surgery [phone]    Remember: Do not eat solid food :After Midnight. Clear liquids until: 12:30 PM  CLEAR LIQUID DIET  Foods Allowed                                                                     Foods Excluded  Coffee and tea, regular and decaf                             liquids that you cannot  Plain Jell-O any favor except red or purple                                           see through such as: Fruit ices (not with fruit pulp)                                     milk, soups, orange juice  Iced Popsicles                                    All solid food Carbonated beverages, regular and diet                                    Cranberry, grape and apple juices Sports drinks like Gatorade Lightly seasoned clear broth or consume(fat free) Sugar, honey syrup  Sample Menu Breakfast                                Lunch                                     Supper Cranberry juice  Beef broth                            Chicken broth Jell-O                                     Grape juice                           Apple juice Coffee or tea                         Jell-O                                      Popsicle                                                Coffee or tea                        Coffee or tea  _____________________________________________________________________  BRUSH YOUR TEETH MORNING OF SURGERY AND RINSE YOUR MOUTH OUT, NO CHEWING GUM CANDY OR MINTS.    Take these medicines the morning of surgery with A SIP OF WATER: amlodipine                               You may not have any metal on your body including hair pins and              piercings  Do not wear jewelry, make-up, lotions, powders or perfumes, deodorant             Do not wear nail polish on your fingernails.  Do not shave  48 hours prior to surgery.    Do not bring valuables to the hospital. [place].  Contacts, dentures or bridgework may not be worn into surgery.  Leave suitcase in the car. After surgery it may be brought to your room.     Patients discharged the day of surgery will not be allowed to drive home. IF YOU ARE HAVING SURGERY AND GOING HOME THE SAME DAY, YOU MUST HAVE AN ADULT TO DRIVE YOU HOME AND BE WITH YOU FOR 24 HOURS. YOU MAY GO HOME BY TAXI OR UBER OR ORTHERWISE, BUT AN ADULT MUST ACCOMPANY YOU HOME AND STAY WITH YOU FOR 24 HOURS.  Name and phone number of your driver:  Special Instructions: N/A              Please read over the following fact sheets you were given: ____________________________________________________________________          [facility] - Preparing for Surgery Before surgery, you can play an important role.  Because skin is not sterile, your skin needs to be as free of germs as possible.  You can reduce the number of germs on your skin by washing with CHG (chlorahexidine gluconate) soap before surgery.  CHG is an antiseptic  cleaner which kills germs and bonds with the skin to continue killing germs even after washing. Please DO NOT use if you have an allergy to CHG or  antibacterial soaps.  If your skin becomes reddened/irritated stop using the CHG and inform your nurse when you arrive at Short Stay. Do not shave (including legs and underarms) for at least 48 hours prior to the first CHG shower.  You may shave your face/neck. Please follow these instructions carefully:  1.  Shower with CHG Soap the night before surgery and the  morning of Surgery.  2.  If you choose to wash your hair, wash your hair first as usual with your  normal  shampoo.  3.  After you shampoo, rinse your hair and body thoroughly to remove the  shampoo.                           4.  Use CHG as you would any other liquid soap.  You can apply chg directly  to the skin and wash                       Gently with a scrungie or clean washcloth.  5.  Apply the CHG Soap to your body ONLY FROM THE NECK DOWN.   Do not use on face/ open                           Wound or open sores. Avoid contact with eyes, ears mouth and genitals (private parts).                       Wash face,  Genitals (private parts) with your normal soap.             6.  Wash thoroughly, paying special attention to the area where your surgery  will be performed.  7.  Thoroughly rinse your body with warm water from the neck down.  8.  DO NOT shower/wash with your normal soap after using and rinsing off  the CHG Soap.                9.  Pat yourself dry with a clean towel.            10.  Wear clean pajamas.            11.  Place clean sheets on your bed the night of your first shower and do not  sleep with pets. Day of Surgery : Do not apply any lotions/deodorants the morning of surgery.  Please wear clean clothes to the hospital/surgery center.  FAILURE TO FOLLOW THESE INSTRUCTIONS MAY RESULT IN THE CANCELLATION OF YOUR SURGERY PATIENT SIGNATURE_________________________________  NURSE SIGNATURE__________________________________  ________________________________________________________________________

## 2020-06-23 NOTE — H&P (View-Only) (Signed)
GYNECOLOGIC ONCOLOGY NEW PATIENT CONSULTATION   Patient Name: Tamara Rhodes  Patient Age: 40 y.o. Date of Service: 08/02/31 Referring Provider: Dr. Lynnda Shields  Primary Care Provider: Patient, No Pcp Per Consulting Provider: Jeral Pinch, MD   Assessment/Plan:  Premenopausal patient with high-grade vulvar dysplasia and abnormal uterine bleeding.  Discussed findings from my exam today as well as her biopsies in November.  In the setting of high-grade dysplasia on biopsy, I recommend excision of remaining visible lesion with a good margin to assure no underlying carcinoma and to decrease the risk of recurrent dysplasia.  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  The remaining lesion that I see is quite small and I think could be easily excised under anesthesia.  Patient is amenable to proceeding with this.  In terms of her abnormal uterine bleeding.  She describes a recent history of worsening and heavy menses.  At her age and in the setting of her obesity, I agree that endometrial sampling is warranted.  This was not possible in clinic due to her difficult exam.  I recommend endometrial biopsy versus dilation and curettage at the time of her vulvar excision.  Her pelvic ultrasound showed an enlarged uterus although no definite uterine masses or fibroids.  Her endometrium, although difficult to visualize, appeared thin.  She recently was prescribed tranexamic acid for her periods although has not noticed a difference since starting the medication.  We discussed other options for management of her abnormal uterine bleeding.  Given her age, weight, and tobacco use, I would favor a progesterone only option and raise the possibility of Mirena IUD placement at the time of her surgery.  The patient was interested in thinking about this and we will tentatively plan for this procedure next week.  Patient's blood pressure was elevated today.  She  is asymptomatic but has been off her blood pressure medication.  She has a refill called into her pharmacy and will pick this up today.  We discussed the plan for a wide local excision/simple vulvectomy, endometrial biopsy versus dilation and curettage, Mirena IUD placement.  Surgery will be scheduled for next Thursday, 2/17.  The risks of surgery were discussed in detail and she understands these to include infection; injury to adjacent organs; bleeding which may require blood transfusion; anesthesia risk; thromboembolic events; possible death; unforeseen complications; possible need for re-exploration; medical complications such as heart attack, stroke, pleural effusion and pneumonia. The patient will receive DVT and antibiotic prophylaxis as indicated. She voiced a clear understanding. She had the opportunity to ask questions. Perioperative instructions were reviewed with her.   A copy of this note was sent to the patient's referring provider.   60 minutes of total time was spent for this patient encounter, including preparation, face-to-face counseling with the patient and coordination of care, and documentation of the encounter.  Jeral Pinch, MD  Division of Gynecologic Oncology  Department of Obstetrics and Gynecology  [facility]  ___________________________________________  Chief Complaint: Chief Complaint  Patient presents with  . VIN III (vulvar intraepithelial neoplasia III)    History of Present Illness:  Tamara Rhodes is a 40 y.o. y.o. female who is seen in consultation at the request of Dr. Elgie Congo for an evaluation of VIN3/CIS.  The patient was initially scheduled to see me on 12/10 for biopsy results but this visit was rescheduled due to Covid infection.  She describes noticing a white patch on  her vulva in September.  She thought it was possibly caused by shaving with her razor.  Previously, she had felt similar "nicks" in her labial skin but it  would improve quickly.  She would have these around the time of her cycle starting.  In September, she noticed that the feeling was persistent and so she looked at her vulva and saw a white spot.  She then called to be seen by a gynecologist.  She also notes that urine can irritate this area and that intercourse has felt uncomfortable.  She denies any associated discharge, bleeding, or pruritus.  She underwent several biopsies on 11/19 both of which showed VIN 3/carcinoma in situ.  She also describes that after her tubal ligation, her menses slowly began getting heavier.  They have worsened significantly in the last 5 years.  She has regular bleeding with some cramping but describes having very heavy bleeding.  When she used to work outside of the house, she would have to take off work the first 2 days of bleeding because of the quantity of bleeding and frequency with which she would have to change her pads and tampons.  She uses super plus tampons and pads now and on her heavier days has to change her tampon as frequently as every 20 minutes at which time the tampon is saturated and she has begun bleeding onto her pad.  She endorses passage of clots, denies any passage of tissue.  Patient lives in [place] with her 2 boys.  She endorses a good appetite without nausea or emesis.  She denies any recent weight changes.  She reports normal bowel and bladder function.  She has a history of tobacco use but quit about 5 days ago.  PAST MEDICAL HISTORY:  Past Medical History:  Diagnosis Date  . Desmoid fibromatosis   . Hypertension   . Obesity   . VIN III (vulvar intraepithelial neoplasia III)      PAST SURGICAL HISTORY:  Past Surgical History:  Procedure Laterality Date  . CESAREAN SECTION     pt had had two  . SOFT TISSUE TUMOR RESECTION  2014   from back  . TUBAL LIGATION     16 years ago with c section    OB/GYN HISTORY:  OB History  Gravida Para Term Preterm AB Living  3 2       2   SAB  IAB Ectopic Multiple Live Births               # Outcome Date GA Lbr Len/2nd Weight Sex Delivery Anes PTL Lv  3 Gravida           2 Para           1 Para             No LMP recorded.  Age at menarche: 20  Age at menopause: n/a Hx of HRT: no Hx of STDs: HPV in the remotes past Last pap: 02/2020 NILM, HR HPV negative History of abnormal pap smears: yes in remote past, did not require biopsies or cervical procedures  SCREENING STUDIES:  Last mammogram: 2019  Last colonoscopy: has never had Last bone mineral density: has never had  MEDICATIONS: Outpatient Encounter Medications as of 06/23/2020  Medication Sig  . acetaminophen (TYLENOL) 325 MG tablet Take 1 tablet by mouth as needed.  . hydrOXYzine (ATARAX/VISTARIL) 25 MG tablet Take 25 mg by mouth at bedtime as needed.  Marland Kitchen ibuprofen (ADVIL,MOTRIN) 800 MG tablet Take 1 tablet (  800 mg total) by mouth 2 (two) times daily as needed.  . tranexamic acid (LYSTEDA) 650 MG TABS tablet Take 2 tablets (1,300 mg total) by mouth 3 (three) times daily. Take during menses for a maximum of five days  . amLODipine (NORVASC) 5 MG tablet Take 1 tablet by mouth daily. (Patient not taking: Reported on 06/23/2020)  . [DISCONTINUED] cyclobenzaprine (FLEXERIL) 5 MG tablet TAKE 1 TABLET BY MOUTH THREE TIMES DAILY AS NEEDED FOR UP TO 10 DAYS FOR MUSCLE SPASMS (Patient not taking: Reported on 05/18/2020)   No facility-administered encounter medications on file as of 06/23/2020.    ALLERGIES:  Allergies  Allergen Reactions  . Penicillins Other (See Comments)    Yeast infection     FAMILY HISTORY:  Family History  Problem Relation Age of Onset  . Osteoarthritis Mother   . Hypertension Sister   . Diabetes Father   . Hypertension Father   . Renal Disease Father   . Cancer - Other Maternal Grandfather        Throat Cancer  . Cancer - Prostate Maternal Uncle   . Breast cancer Neg Hx   . Endometrial cancer Neg Hx   . Pancreatic cancer Neg Hx   . Colon  cancer Neg Hx      SOCIAL HISTORY:  Social Connections: Not on file    REVIEW OF SYSTEMS:  + back pain Denies appetite changes, fevers, chills, fatigue, unexplained weight changes. Denies hearing loss, neck lumps or masses, mouth sores, ringing in ears or voice changes. Denies cough or wheezing.  Denies shortness of breath. Denies chest pain or palpitations. Denies leg swelling. Denies abdominal distention, pain, blood in stools, constipation, diarrhea, nausea, vomiting, or early satiety. Denies pain with intercourse, dysuria, frequency, hematuria or incontinence. Denies hot flashes, pelvic pain, vaginal bleeding or vaginal discharge.   Denies joint pain or muscle pain/cramps. Denies itching, rash, or wounds. Denies dizziness, headaches, numbness or seizures. Denies swollen lymph nodes or glands, denies easy bruising or bleeding. Denies anxiety, depression, confusion, or decreased concentration.  Physical Exam:  Vital Signs for this encounter:  Blood pressure (!) 175/90, pulse 78, temperature (!) 96.8 F (36 C), temperature source Tympanic, resp. rate 18, height 5' (1.524 m), weight 266 lb (120.7 kg), SpO2 100 %. Body mass index is 51.95 kg/m. General: Alert, oriented, no acute distress.  HEENT: Normocephalic, atraumatic. Sclera anicteric.  Chest: Clear to auscultation bilaterally. No wheezes, rhonchi, or rales. Cardiovascular: Regular rate and rhythm, no murmurs, rubs, or gallops.  Abdomen: Obese. Normoactive bowel sounds. Soft, nondistended, nontender to palpation. No masses or hepatosplenomegaly appreciated. No palpable fluid wave.  Extremities: Grossly normal range of motion. Warm, well perfused. No edema bilaterally.  Skin: No rashes or lesions.  Lymphatics: No cervical, supraclavicular, or inguinal adenopathy.  GU:  Normal external female genitalia.   Small, approximately 3 mm mildly ulcerated versus healing biopsy site noted in the left mid labia.  No hyperpigmentation or  other discoloration noted.  No discharge or bleeding.               Bladder/urethra:  No lesions or masses, well supported bladder             Vagina: Well rugated, physiologic discharge noted.             Cervix: Unable to visualize with speculum.             Uterus: Approximately 12-14 cm, retroverted, mobile.  Cervix appreciated posterior to the pubic symphysis, small  and flush with the vaginal apex.             Adnexa: No masses appreciated.  LABORATORY AND RADIOLOGIC DATA:  Outside medical records were reviewed to synthesize the above history, along with the history and physical obtained during the visit.   No results found for: WBC, HGB, HCT, PLT, GLUCOSE, CHOL, TRIG, HDL, LDLDIRECT, LDLCALC, ALT, AST, NA, K, CL, CREATININE, BUN, CO2, TSH, PSA, INR, GLUF, HGBA1C, MICROALBUR  Pathology 11/19: A. VULVA, LEFT, BIOPSY:  - High grade squamous intraepithelial lesion, VIN-III (severe  dysplasia/CIS).   B. VULVA, LEFT, BIOPSY:  - High grade squamous intraepithelial lesion, VIN-III (severe  dysplasia/CIS).    Pelvic ultrasound on 11/4: Uterus  Measurements: 14.7 x 4.6 x 8.3 cm = volume: 298 mL. Retroflexed. Mildly heterogeneous echogenicity. No definite evidence of uterine mass, though assessment of the uterus is suboptimal due to uterine position and body habitus.  Endometrium  Thickness: 3 mm on transabdominal imaging, though suboptimally seen. No obvious endometrial mass or fluid on limited visualization. Inadequately visualized on transvaginal imaging due to uterine position.  Right ovary  Measurements: 2.8 x 1.5 x 2.3 cm = volume: 4.8 mL. Normal morphology without mass  Left ovary  Measurements: 3.0 x 1.5 x 1.8 cm = volume: 4.3 mL. Normal morphology without mass  Other findings  No free pelvic fluid. No adnexal masses.  IMPRESSION: Suboptimal exam due to body habitus and uterine position.  No definite pelvic sonographic abnormalities identified  with limitations of exam as above.

## 2020-06-23 NOTE — Progress Notes (Signed)
Pt posted for surgery 06-29-2020 for Dr Berline Lopes. Pt BMI 51.95.  Per ambulatory surgery center anesthesia guidelines pt not a candidate for [facility] due to BMI over 50.  Called and spoke w/ Barbaraann Share RN , Dr Berline Lopes office, that pt will need to be done at main OR.

## 2020-06-23 NOTE — Progress Notes (Signed)
Pt. Needs orders for upcomming surgery.PAT and labs on 06/26/20.Thanks.

## 2020-06-23 NOTE — Progress Notes (Signed)
GYNECOLOGIC ONCOLOGY NEW PATIENT CONSULTATION   Patient Name: Tamara Rhodes  Patient Age: 40 y.o. Date of Service: 08/02/31 Referring Provider: Dr. Lynnda Shields  Primary Care Provider: Patient, No Pcp Per Consulting Provider: Jeral Pinch, MD   Assessment/Plan:  Premenopausal patient with high-grade vulvar dysplasia and abnormal uterine bleeding.  Discussed findings from my exam today as well as her biopsies in November.  In the setting of high-grade dysplasia on biopsy, I recommend excision of remaining visible lesion with a good margin to assure no underlying carcinoma and to decrease the risk of recurrent dysplasia.  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  The remaining lesion that I see is quite small and I think could be easily excised under anesthesia.  Patient is amenable to proceeding with this.  In terms of her abnormal uterine bleeding.  She describes a recent history of worsening and heavy menses.  At her age and in the setting of her obesity, I agree that endometrial sampling is warranted.  This was not possible in clinic due to her difficult exam.  I recommend endometrial biopsy versus dilation and curettage at the time of her vulvar excision.  Her pelvic ultrasound showed an enlarged uterus although no definite uterine masses or fibroids.  Her endometrium, although difficult to visualize, appeared thin.  She recently was prescribed tranexamic acid for her periods although has not noticed a difference since starting the medication.  We discussed other options for management of her abnormal uterine bleeding.  Given her age, weight, and tobacco use, I would favor a progesterone only option and raise the possibility of Mirena IUD placement at the time of her surgery.  The patient was interested in thinking about this and we will tentatively plan for this procedure next week.  Patient's blood pressure was elevated today.  She  is asymptomatic but has been off her blood pressure medication.  She has a refill called into her pharmacy and will pick this up today.  We discussed the plan for a wide local excision/simple vulvectomy, endometrial biopsy versus dilation and curettage, Mirena IUD placement.  Surgery will be scheduled for next Thursday, 2/17.  The risks of surgery were discussed in detail and she understands these to include infection; injury to adjacent organs; bleeding which may require blood transfusion; anesthesia risk; thromboembolic events; possible death; unforeseen complications; possible need for re-exploration; medical complications such as heart attack, stroke, pleural effusion and pneumonia. The patient will receive DVT and antibiotic prophylaxis as indicated. She voiced a clear understanding. She had the opportunity to ask questions. Perioperative instructions were reviewed with her.   A copy of this note was sent to the patient's referring provider.   60 minutes of total time was spent for this patient encounter, including preparation, face-to-face counseling with the patient and coordination of care, and documentation of the encounter.  Jeral Pinch, MD  Division of Gynecologic Oncology  Department of Obstetrics and Gynecology  [facility]  ___________________________________________  Chief Complaint: Chief Complaint  Patient presents with  . VIN III (vulvar intraepithelial neoplasia III)    History of Present Illness:  Tamara Rhodes is a 39 y.o. y.o. female who is seen in consultation at the request of Dr. Elgie Congo for an evaluation of VIN3/CIS.  The patient was initially scheduled to see me on 12/10 for biopsy results but this visit was rescheduled due to Covid infection.  She describes noticing a white patch on  her vulva in September.  She thought it was possibly caused by shaving with her razor.  Previously, she had felt similar "nicks" in her labial skin but it  would improve quickly.  She would have these around the time of her cycle starting.  In September, she noticed that the feeling was persistent and so she looked at her vulva and saw a white spot.  She then called to be seen by a gynecologist.  She also notes that urine can irritate this area and that intercourse has felt uncomfortable.  She denies any associated discharge, bleeding, or pruritus.  She underwent several biopsies on 11/19 both of which showed VIN 3/carcinoma in situ.  She also describes that after her tubal ligation, her menses slowly began getting heavier.  They have worsened significantly in the last 5 years.  She has regular bleeding with some cramping but describes having very heavy bleeding.  When she used to work outside of the house, she would have to take off work the first 2 days of bleeding because of the quantity of bleeding and frequency with which she would have to change her pads and tampons.  She uses super plus tampons and pads now and on her heavier days has to change her tampon as frequently as every 20 minutes at which time the tampon is saturated and she has begun bleeding onto her pad.  She endorses passage of clots, denies any passage of tissue.  Patient lives in [place] with her 2 boys.  She endorses a good appetite without nausea or emesis.  She denies any recent weight changes.  She reports normal bowel and bladder function.  She has a history of tobacco use but quit about 5 days ago.  PAST MEDICAL HISTORY:  Past Medical History:  Diagnosis Date  . Desmoid fibromatosis   . Hypertension   . Obesity   . VIN III (vulvar intraepithelial neoplasia III)      PAST SURGICAL HISTORY:  Past Surgical History:  Procedure Laterality Date  . CESAREAN SECTION     pt had had two  . SOFT TISSUE TUMOR RESECTION  2014   from back  . TUBAL LIGATION     16 years ago with c section    OB/GYN HISTORY:  OB History  Gravida Para Term Preterm AB Living  3 2       2   SAB  IAB Ectopic Multiple Live Births               # Outcome Date GA Lbr Len/2nd Weight Sex Delivery Anes PTL Lv  3 Gravida           2 Para           1 Para             No LMP recorded.  Age at menarche: 70  Age at menopause: n/a Hx of HRT: no Hx of STDs: HPV in the remotes past Last pap: 02/2020 NILM, HR HPV negative History of abnormal pap smears: yes in remote past, did not require biopsies or cervical procedures  SCREENING STUDIES:  Last mammogram: 2019  Last colonoscopy: has never had Last bone mineral density: has never had  MEDICATIONS: Outpatient Encounter Medications as of 06/23/2020  Medication Sig  . acetaminophen (TYLENOL) 325 MG tablet Take 1 tablet by mouth as needed.  . hydrOXYzine (ATARAX/VISTARIL) 25 MG tablet Take 25 mg by mouth at bedtime as needed.  Marland Kitchen ibuprofen (ADVIL,MOTRIN) 800 MG tablet Take 1 tablet (  800 mg total) by mouth 2 (two) times daily as needed.  . tranexamic acid (LYSTEDA) 650 MG TABS tablet Take 2 tablets (1,300 mg total) by mouth 3 (three) times daily. Take during menses for a maximum of five days  . amLODipine (NORVASC) 5 MG tablet Take 1 tablet by mouth daily. (Patient not taking: Reported on 06/23/2020)  . [DISCONTINUED] cyclobenzaprine (FLEXERIL) 5 MG tablet TAKE 1 TABLET BY MOUTH THREE TIMES DAILY AS NEEDED FOR UP TO 10 DAYS FOR MUSCLE SPASMS (Patient not taking: Reported on 05/18/2020)   No facility-administered encounter medications on file as of 06/23/2020.    ALLERGIES:  Allergies  Allergen Reactions  . Penicillins Other (See Comments)    Yeast infection     FAMILY HISTORY:  Family History  Problem Relation Age of Onset  . Osteoarthritis Mother   . Hypertension Sister   . Diabetes Father   . Hypertension Father   . Renal Disease Father   . Cancer - Other Maternal Grandfather        Throat Cancer  . Cancer - Prostate Maternal Uncle   . Breast cancer Neg Hx   . Endometrial cancer Neg Hx   . Pancreatic cancer Neg Hx   . Colon  cancer Neg Hx      SOCIAL HISTORY:  Social Connections: Not on file    REVIEW OF SYSTEMS:  + back pain Denies appetite changes, fevers, chills, fatigue, unexplained weight changes. Denies hearing loss, neck lumps or masses, mouth sores, ringing in ears or voice changes. Denies cough or wheezing.  Denies shortness of breath. Denies chest pain or palpitations. Denies leg swelling. Denies abdominal distention, pain, blood in stools, constipation, diarrhea, nausea, vomiting, or early satiety. Denies pain with intercourse, dysuria, frequency, hematuria or incontinence. Denies hot flashes, pelvic pain, vaginal bleeding or vaginal discharge.   Denies joint pain or muscle pain/cramps. Denies itching, rash, or wounds. Denies dizziness, headaches, numbness or seizures. Denies swollen lymph nodes or glands, denies easy bruising or bleeding. Denies anxiety, depression, confusion, or decreased concentration.  Physical Exam:  Vital Signs for this encounter:  Blood pressure (!) 175/90, pulse 78, temperature (!) 96.8 F (36 C), temperature source Tympanic, resp. rate 18, height 5' (1.524 m), weight 266 lb (120.7 kg), SpO2 100 %. Body mass index is 51.95 kg/m. General: Alert, oriented, no acute distress.  HEENT: Normocephalic, atraumatic. Sclera anicteric.  Chest: Clear to auscultation bilaterally. No wheezes, rhonchi, or rales. Cardiovascular: Regular rate and rhythm, no murmurs, rubs, or gallops.  Abdomen: Obese. Normoactive bowel sounds. Soft, nondistended, nontender to palpation. No masses or hepatosplenomegaly appreciated. No palpable fluid wave.  Extremities: Grossly normal range of motion. Warm, well perfused. No edema bilaterally.  Skin: No rashes or lesions.  Lymphatics: No cervical, supraclavicular, or inguinal adenopathy.  GU:  Normal external female genitalia.   Small, approximately 3 mm mildly ulcerated versus healing biopsy site noted in the left mid labia.  No hyperpigmentation or  other discoloration noted.  No discharge or bleeding.               Bladder/urethra:  No lesions or masses, well supported bladder             Vagina: Well rugated, physiologic discharge noted.             Cervix: Unable to visualize with speculum.             Uterus: Approximately 12-14 cm, retroverted, mobile.  Cervix appreciated posterior to the pubic symphysis, small  and flush with the vaginal apex.             Adnexa: No masses appreciated.  LABORATORY AND RADIOLOGIC DATA:  Outside medical records were reviewed to synthesize the above history, along with the history and physical obtained during the visit.   No results found for: WBC, HGB, HCT, PLT, GLUCOSE, CHOL, TRIG, HDL, LDLDIRECT, LDLCALC, ALT, AST, NA, K, CL, CREATININE, BUN, CO2, TSH, PSA, INR, GLUF, HGBA1C, MICROALBUR  Pathology 11/19: A. VULVA, LEFT, BIOPSY:  - High grade squamous intraepithelial lesion, VIN-III (severe  dysplasia/CIS).   B. VULVA, LEFT, BIOPSY:  - High grade squamous intraepithelial lesion, VIN-III (severe  dysplasia/CIS).    Pelvic ultrasound on 11/4: Uterus  Measurements: 14.7 x 4.6 x 8.3 cm = volume: 298 mL. Retroflexed. Mildly heterogeneous echogenicity. No definite evidence of uterine mass, though assessment of the uterus is suboptimal due to uterine position and body habitus.  Endometrium  Thickness: 3 mm on transabdominal imaging, though suboptimally seen. No obvious endometrial mass or fluid on limited visualization. Inadequately visualized on transvaginal imaging due to uterine position.  Right ovary  Measurements: 2.8 x 1.5 x 2.3 cm = volume: 4.8 mL. Normal morphology without mass  Left ovary  Measurements: 3.0 x 1.5 x 1.8 cm = volume: 4.3 mL. Normal morphology without mass  Other findings  No free pelvic fluid. No adnexal masses.  IMPRESSION: Suboptimal exam due to body habitus and uterine position.  No definite pelvic sonographic abnormalities identified  with limitations of exam as above.

## 2020-06-26 ENCOUNTER — Other Ambulatory Visit: Payer: Self-pay

## 2020-06-26 ENCOUNTER — Encounter (HOSPITAL_COMMUNITY): Payer: Self-pay

## 2020-06-26 ENCOUNTER — Other Ambulatory Visit (HOSPITAL_COMMUNITY): Payer: Medicaid Other

## 2020-06-26 ENCOUNTER — Other Ambulatory Visit: Payer: Self-pay | Admitting: Gynecologic Oncology

## 2020-06-26 ENCOUNTER — Encounter (HOSPITAL_COMMUNITY)
Admission: RE | Admit: 2020-06-26 | Discharge: 2020-06-26 | Disposition: A | Discharge status: Home / Self Care | Payer: Medicaid Other | Source: Ambulatory Visit | Attending: Gynecologic Oncology | Admitting: Gynecologic Oncology

## 2020-06-26 DIAGNOSIS — Z01818 Encounter for other preprocedural examination: Secondary | ICD-10-CM | POA: Insufficient documentation

## 2020-06-26 DIAGNOSIS — D071 Carcinoma in situ of vulva: Secondary | ICD-10-CM

## 2020-06-26 DIAGNOSIS — N939 Abnormal uterine and vaginal bleeding, unspecified: Secondary | ICD-10-CM

## 2020-06-26 HISTORY — DX: Anemia, unspecified: D64.9

## 2020-06-26 LAB — BASIC METABOLIC PANEL
Anion gap: 8 (ref 5–15)
BUN: 11 mg/dL (ref 6–20)
CO2: 24 mmol/L (ref 22–32)
Calcium: 9.3 mg/dL (ref 8.9–10.3)
Chloride: 106 mmol/L (ref 98–111)
Creatinine, Ser: 0.72 mg/dL (ref 0.44–1.00)
GFR, Estimated: >60 mL/min (ref >60)
Glucose, Bld: 104 mg/dL — ABNORMAL HIGH (ref 70–99)
Potassium: 4.3 mmol/L (ref 3.5–5.1)
Sodium: 138 mmol/L (ref 135–145)

## 2020-06-26 LAB — CBC
HCT: 34.3 % — ABNORMAL LOW (ref 36.0–46.0)
Hemoglobin: 10.5 g/dL — ABNORMAL LOW (ref 12.0–15.0)
MCH: 25 pg — ABNORMAL LOW (ref 26.0–34.0)
MCHC: 30.6 g/dL (ref 30.0–36.0)
MCV: 81.7 fL (ref 80.0–100.0)
Platelets: 347 10*3/uL (ref 150–400)
RBC: 4.2 MIL/uL (ref 3.87–5.11)
RDW: 21 % — ABNORMAL HIGH (ref 11.5–15.5)
WBC: 9.3 10*3/uL (ref 4.0–10.5)
nRBC: 0 % (ref 0.0–0.2)

## 2020-06-26 NOTE — Progress Notes (Signed)
COVID Vaccine Completed: Yes Date COVID Vaccine completed: 09/23/19 COVID vaccine manufacturer: [place]     PCP - Cassandria Santee: [facility] Cardiologist -   Chest x-ray -  EKG -  Stress Test -  ECHO -  Cardiac Cath -  Pacemaker/ICD device last checked:  Sleep Study -  CPAP -   Fasting Blood Sugar -  Checks Blood Sugar _____ times a day  Blood Thinner Instructions: Aspirin Instructions: Last Dose:  Anesthesia review:   Patient denies shortness of breath, fever, cough and chest pain at PAT appointment   Patient verbalized understanding of instructions that were given to them at the PAT appointment. Patient was also instructed that they will need to review over the PAT instructions again at home before surgery.

## 2020-06-28 ENCOUNTER — Telehealth: Payer: Self-pay

## 2020-06-28 NOTE — Telephone Encounter (Signed)
Tamara Rhodes stated that she understands her written pre op instructions from 06-26-20. Inquired about BP med  amlodipine 5 mg tabs. Pt stated that she p/u refill and  has been taking medication as prescribed. Pt did not have covid test results in chart from 06-26-20.  Pt stated that she had covid a month  ago and provided a documented test result to pre surgical testing on 06-26-20. A copy of the lab result was placed in her chart.  No covid test needed to be repeated. Appointment not canceled.

## 2020-06-29 ENCOUNTER — Ambulatory Visit (HOSPITAL_COMMUNITY): Payer: Medicaid Other | Admitting: Certified Registered"

## 2020-06-29 ENCOUNTER — Encounter (HOSPITAL_COMMUNITY)
Admission: RE | Disposition: A | Discharge status: Home / Self Care | Payer: Self-pay | Source: Home / Self Care | Attending: Gynecologic Oncology

## 2020-06-29 ENCOUNTER — Encounter (HOSPITAL_COMMUNITY): Payer: Self-pay | Admitting: Gynecologic Oncology

## 2020-06-29 ENCOUNTER — Ambulatory Visit (HOSPITAL_COMMUNITY)
Admission: RE | Admit: 2020-06-29 | Discharge: 2020-06-29 | Disposition: A | Discharge status: Home / Self Care | Payer: Medicaid Other | Attending: Gynecologic Oncology | Admitting: Gynecologic Oncology

## 2020-06-29 DIAGNOSIS — N939 Abnormal uterine and vaginal bleeding, unspecified: Secondary | ICD-10-CM | POA: Insufficient documentation

## 2020-06-29 DIAGNOSIS — D071 Carcinoma in situ of vulva: Secondary | ICD-10-CM | POA: Diagnosis not present

## 2020-06-29 HISTORY — PX: DILATION AND CURETTAGE OF UTERUS: SHX78

## 2020-06-29 HISTORY — PX: VULVECTOMY: SHX1086

## 2020-06-29 HISTORY — PX: INTRAUTERINE DEVICE (IUD) INSERTION: SHX5877

## 2020-06-29 LAB — PREGNANCY, URINE: Preg Test, Ur: NEGATIVE

## 2020-06-29 SURGERY — WIDE EXCISION VULVECTOMY
Anesthesia: General

## 2020-06-29 MED ORDER — LIDOCAINE HCL (PF) 2 % IJ SOLN
INTRAMUSCULAR | Status: AC
  Filled 2020-06-29: qty 5

## 2020-06-29 MED ORDER — GABAPENTIN 300 MG PO CAPS
300.0000 mg | ORAL_CAPSULE | ORAL | Status: AC
  Administered 2020-06-29: 300 mg via ORAL
  Filled 2020-06-29: qty 1

## 2020-06-29 MED ORDER — FENTANYL CITRATE (PF) 100 MCG/2ML IJ SOLN
25.0000 ug | INTRAMUSCULAR | Status: DC | PRN
Start: 2020-06-29 — End: 2020-06-29

## 2020-06-29 MED ORDER — ONDANSETRON HCL 4 MG/2ML IJ SOLN
INTRAMUSCULAR | Status: AC
  Filled 2020-06-29: qty 2

## 2020-06-29 MED ORDER — IODINE STRONG (LUGOLS) 5 % PO SOLN
ORAL | Status: DC | PRN
  Administered 2020-06-29: 14 mL

## 2020-06-29 MED ORDER — ACETAMINOPHEN 500 MG PO TABS
1000.0000 mg | ORAL_TABLET | ORAL | Status: DC
  Filled 2020-06-29: qty 2

## 2020-06-29 MED ORDER — MEPERIDINE HCL 50 MG/ML IJ SOLN: 6.2500 mg | INTRAMUSCULAR | Status: DC | PRN

## 2020-06-29 MED ORDER — SUCCINYLCHOLINE CHLORIDE 200 MG/10ML IV SOSY
PREFILLED_SYRINGE | INTRAVENOUS | Status: AC
  Filled 2020-06-29: qty 10

## 2020-06-29 MED ORDER — FENTANYL CITRATE (PF) 250 MCG/5ML IJ SOLN
INTRAMUSCULAR | Status: AC
  Filled 2020-06-29: qty 5

## 2020-06-29 MED ORDER — EPHEDRINE SULFATE-NACL 50-0.9 MG/10ML-% IV SOSY
PREFILLED_SYRINGE | INTRAVENOUS | Status: DC | PRN
  Administered 2020-06-29: 5 mg via INTRAVENOUS

## 2020-06-29 MED ORDER — ACETAMINOPHEN 325 MG PO TABS: 325.0000 mg | ORAL_TABLET | ORAL | Status: DC | PRN

## 2020-06-29 MED ORDER — FERRIC SUBSULFATE 259 MG/GM EX SOLN
CUTANEOUS | Status: AC
  Filled 2020-06-29: qty 8

## 2020-06-29 MED ORDER — ONDANSETRON HCL 4 MG/2ML IJ SOLN: 4.0000 mg | Freq: Once | INTRAMUSCULAR | Status: DC | PRN

## 2020-06-29 MED ORDER — PROPOFOL 10 MG/ML IV BOLUS
INTRAVENOUS | Status: AC
  Filled 2020-06-29: qty 20

## 2020-06-29 MED ORDER — LEVONORGESTREL 20 MCG/24HR IU IUD
INTRAUTERINE_SYSTEM | INTRAUTERINE | Status: AC
  Filled 2020-06-29: qty 1

## 2020-06-29 MED ORDER — PROPOFOL 10 MG/ML IV BOLUS
INTRAVENOUS | Status: DC | PRN
  Administered 2020-06-29: 150 mg via INTRAVENOUS

## 2020-06-29 MED ORDER — CELECOXIB 200 MG PO CAPS
400.0000 mg | ORAL_CAPSULE | ORAL | Status: AC
  Administered 2020-06-29: 400 mg via ORAL
  Filled 2020-06-29: qty 2

## 2020-06-29 MED ORDER — OXYCODONE HCL 5 MG PO TABS: 5.0000 mg | ORAL_TABLET | ORAL | 0 refills | Status: AC | PRN

## 2020-06-29 MED ORDER — DEXAMETHASONE SODIUM PHOSPHATE 10 MG/ML IJ SOLN
INTRAMUSCULAR | Status: DC | PRN
  Administered 2020-06-29: 10 mg via INTRAVENOUS

## 2020-06-29 MED ORDER — EPHEDRINE 5 MG/ML INJ
INTRAVENOUS | Status: AC
  Filled 2020-06-29: qty 10

## 2020-06-29 MED ORDER — LACTATED RINGERS IV SOLN: INTRAVENOUS | Status: DC

## 2020-06-29 MED ORDER — OXYCODONE HCL 5 MG PO TABS: 5.0000 mg | ORAL_TABLET | Freq: Once | ORAL | Status: DC | PRN

## 2020-06-29 MED ORDER — PHENYLEPHRINE HCL (PRESSORS) 10 MG/ML IV SOLN
INTRAVENOUS | Status: AC
  Filled 2020-06-29: qty 1

## 2020-06-29 MED ORDER — DEXAMETHASONE SODIUM PHOSPHATE 4 MG/ML IJ SOLN: 4.0000 mg | INTRAMUSCULAR | Status: DC

## 2020-06-29 MED ORDER — 0.9 % SODIUM CHLORIDE (POUR BTL) OPTIME
TOPICAL | Status: DC | PRN
  Administered 2020-06-29: 1000 mL

## 2020-06-29 MED ORDER — OXYCODONE HCL 5 MG/5ML PO SOLN: 5.0000 mg | Freq: Once | ORAL | Status: DC | PRN

## 2020-06-29 MED ORDER — ONDANSETRON HCL 4 MG/2ML IJ SOLN
INTRAMUSCULAR | Status: DC | PRN
  Administered 2020-06-29: 4 mg via INTRAVENOUS

## 2020-06-29 MED ORDER — BUPIVACAINE HCL 0.25 % IJ SOLN
INTRAMUSCULAR | Status: AC
  Filled 2020-06-29: qty 1

## 2020-06-29 MED ORDER — MIDAZOLAM HCL 2 MG/2ML IJ SOLN
INTRAMUSCULAR | Status: DC | PRN
  Administered 2020-06-29: 2 mg via INTRAVENOUS

## 2020-06-29 MED ORDER — DEXAMETHASONE SODIUM PHOSPHATE 10 MG/ML IJ SOLN
INTRAMUSCULAR | Status: AC
  Filled 2020-06-29: qty 1

## 2020-06-29 MED ORDER — IBUPROFEN 800 MG PO TABS: 800.0000 mg | ORAL_TABLET | Freq: Three times a day (TID) | ORAL | 0 refills | Status: AC | PRN

## 2020-06-29 MED ORDER — FENTANYL CITRATE (PF) 250 MCG/5ML IJ SOLN
INTRAMUSCULAR | Status: DC | PRN
  Administered 2020-06-29 (×5): 50 ug via INTRAVENOUS

## 2020-06-29 MED ORDER — IODINE STRONG (LUGOLS) 5 % PO SOLN
ORAL | Status: AC
  Filled 2020-06-29: qty 1

## 2020-06-29 MED ORDER — PHENYLEPHRINE 40 MCG/ML (10ML) SYRINGE FOR IV PUSH (FOR BLOOD PRESSURE SUPPORT)
PREFILLED_SYRINGE | INTRAVENOUS | Status: AC
  Filled 2020-06-29: qty 10

## 2020-06-29 MED ORDER — MIDAZOLAM HCL 2 MG/2ML IJ SOLN
INTRAMUSCULAR | Status: AC
  Filled 2020-06-29: qty 2

## 2020-06-29 MED ORDER — ORAL CARE MOUTH RINSE: 15.0000 mL | Freq: Once | OROMUCOSAL | Status: AC

## 2020-06-29 MED ORDER — LIDOCAINE 2% (20 MG/ML) 5 ML SYRINGE
INTRAMUSCULAR | Status: DC | PRN
  Administered 2020-06-29: 100 mg via INTRAVENOUS

## 2020-06-29 MED ORDER — SENNOSIDES-DOCUSATE SODIUM 8.6-50 MG PO TABS: 2.0000 | ORAL_TABLET | Freq: Every day | ORAL | 0 refills | Status: AC

## 2020-06-29 MED ORDER — ROCURONIUM BROMIDE 10 MG/ML (PF) SYRINGE
PREFILLED_SYRINGE | INTRAVENOUS | Status: AC
  Filled 2020-06-29: qty 10

## 2020-06-29 MED ORDER — SCOPOLAMINE 1 MG/3DAYS TD PT72
1.0000 | MEDICATED_PATCH | TRANSDERMAL | Status: DC
  Administered 2020-06-29: 1.5 mg via TRANSDERMAL
  Filled 2020-06-29: qty 1

## 2020-06-29 MED ORDER — ACETIC ACID 5 % SOLN
Status: AC
  Filled 2020-06-29: qty 50

## 2020-06-29 MED ORDER — ACETAMINOPHEN 160 MG/5ML PO SOLN: 325.0000 mg | ORAL | Status: DC | PRN

## 2020-06-29 MED ORDER — CHLORHEXIDINE GLUCONATE 0.12 % MT SOLN
15.0000 mL | Freq: Once | OROMUCOSAL | Status: AC
  Administered 2020-06-29: 15 mL via OROMUCOSAL

## 2020-06-29 SURGICAL SUPPLY — 48 items
BACTOSHIELD CHG 4% 4OZ (MISCELLANEOUS) ×1
BLADE SURG 15 STRL LF DISP TIS (BLADE) ×2 IMPLANT
BLADE SURG 15 STRL SS (BLADE) ×2
BNDG GAUZE ELAST 4 BULKY (GAUZE/BANDAGES/DRESSINGS) IMPLANT
BRIEF STRETCH FOR OB PAD LRG (UNDERPADS AND DIAPERS) ×2 IMPLANT
CATH ROBINSON RED A/P 16FR (CATHETERS) ×2 IMPLANT
COVER SURGICAL LIGHT HANDLE (MISCELLANEOUS) ×2 IMPLANT
COVER WAND RF STERILE (DRAPES) IMPLANT
DRAPE SHEET LG 3/4 BI-LAMINATE (DRAPES) ×2 IMPLANT
DRAPE UNDERBUTTOCKS STRL (DISPOSABLE) ×2 IMPLANT
DRSG PAD ABDOMINAL 8X10 ST (GAUZE/BANDAGES/DRESSINGS) IMPLANT
DRSG TELFA 3X8 NADH (GAUZE/BANDAGES/DRESSINGS) ×2 IMPLANT
GAUZE 4X4 16PLY RFD (DISPOSABLE) ×2 IMPLANT
GLOVE SURG ENC MOIS LTX SZ6 (GLOVE) ×4 IMPLANT
GLOVE SURG ENC MOIS LTX SZ6.5 (GLOVE) ×2 IMPLANT
GOWN STRL REUS W/ TWL LRG LVL3 (GOWN DISPOSABLE) ×2 IMPLANT
GOWN STRL REUS W/TWL LRG LVL3 (GOWN DISPOSABLE) ×2
KIT BASIN OR (CUSTOM PROCEDURE TRAY) ×2 IMPLANT
KIT TURNOVER KIT A (KITS) ×2 IMPLANT
Mirena 52mg ×2 IMPLANT
NEEDLE HYPO 25X1 1.5 SAFETY (NEEDLE) ×2 IMPLANT
NEEDLE SPNL 22GX3.5 QUINCKE BK (NEEDLE) ×2 IMPLANT
NS IRRIG 1000ML POUR BTL (IV SOLUTION) ×2 IMPLANT
PACK LITHOTOMY IV (CUSTOM PROCEDURE TRAY) ×2 IMPLANT
PACK PERINEAL COLD (PAD) ×2 IMPLANT
PAD OB MATERNITY 4.3X12.25 (PERSONAL CARE ITEMS) ×2 IMPLANT
PAD TELFA 2X3 NADH STRL (GAUZE/BANDAGES/DRESSINGS) IMPLANT
PENCIL SMOKE EVACUATOR (MISCELLANEOUS) IMPLANT
SCRUB CHG 4% DYNA-HEX 4OZ (MISCELLANEOUS) ×1 IMPLANT
SPONGE LAP 18X18 RF (DISPOSABLE) IMPLANT
SURGILUBE 2OZ TUBE FLIPTOP (MISCELLANEOUS) ×2 IMPLANT
SUT VIC AB 0 CT1 27 (SUTURE) ×1
SUT VIC AB 0 CT1 27XBRD ANTBC (SUTURE) ×1 IMPLANT
SUT VIC AB 2-0 SH 27 (SUTURE) ×4
SUT VIC AB 2-0 SH 27X BRD (SUTURE) ×4 IMPLANT
SUT VIC AB 3-0 SH 27 (SUTURE) ×3
SUT VIC AB 3-0 SH 27X BRD (SUTURE) ×2 IMPLANT
SUT VIC AB 3-0 SH 27XBRD (SUTURE) ×1 IMPLANT
SUT VIC AB 4-0 PS2 27 (SUTURE) ×4 IMPLANT
SUT VIC AB 4-0 SH 18 (SUTURE) ×4 IMPLANT
SUT VIC AB 4-0 SH 27 (SUTURE) ×1
SUT VIC AB 4-0 SH 27XBRD (SUTURE) ×1 IMPLANT
SYR BULB IRRIG 60ML STRL (SYRINGE) ×2 IMPLANT
SYR CONTROL 10ML LL (SYRINGE) ×2 IMPLANT
TOWEL OR 17X26 10 PK STRL BLUE (TOWEL DISPOSABLE) ×2 IMPLANT
TOWEL OR NON WOVEN STRL DISP B (DISPOSABLE) ×2 IMPLANT
TRAY FOLEY MTR SLVR 16FR STAT (SET/KITS/TRAYS/PACK) ×2 IMPLANT
UNDERPAD 30X36 HEAVY ABSORB (UNDERPADS AND DIAPERS) ×4 IMPLANT

## 2020-06-29 NOTE — Transfer of Care (Signed)
Immediate Anesthesia Transfer of Care Note  Patient: Tamara Rhodes  Procedure(s) Performed: WIDE EXCISION VULVECTOMY WITH POSSIBLE ENDOMETRIAL BIOPSY (N/A ) INTRAUTERINE DEVICE (IUD) INSERTION MIRENA (N/A ) POSSIBLE DILATATION AND CURETTAGE (N/A )  Patient Location: PACU  Anesthesia Type:General  Level of Consciousness: awake and alert   Airway & Oxygen Therapy: Patient Spontanous Breathing and Patient connected to face mask oxygen  Post-op Assessment: Report given to RN and Post -op Vital signs reviewed and stable  Post vital signs: Reviewed and stable  Last Vitals:  Vitals Value Taken Time  BP 139/89 06/29/20 1545  Temp    Pulse 92 06/29/20 1546  Resp 17 06/29/20 1546  SpO2 100 % 06/29/20 1546  Vitals shown include unvalidated device data.  Last Pain:  Vitals:   06/29/20 1212  TempSrc:   PainSc: 0-No pain         Complications: No complications documented.

## 2020-06-29 NOTE — Anesthesia Postprocedure Evaluation (Signed)
Anesthesia Post Note  Patient: Tamara Rhodes  Procedure(s) Performed: WIDE EXCISION VULVECTOMY WITH POSSIBLE ENDOMETRIAL BIOPSY (N/A ) INTRAUTERINE DEVICE (IUD) INSERTION MIRENA (N/A ) POSSIBLE DILATATION AND CURETTAGE (N/A )     Anesthesia Type: General Anesthetic complications: no   No complications documented.  Last Vitals:  Vitals:   06/29/20 1600 06/29/20 1615  BP: 129/85 (!) 141/97  Pulse: 88 80  Resp: (!) 26 18  Temp:    SpO2: 97% 100%    Last Pain:  Vitals:   06/29/20 1615  TempSrc:   PainSc: 0-No pain                 Siddhanth Denk

## 2020-06-29 NOTE — Discharge Instructions (Signed)
We recommend purchasing several bags of frozen green peas and dividing them into ziploc bags. You will want to keep these in the freezer and have them ready to use as ice packs to the vulvar incision. Once the ice pack is no longer cold, you can get another from the freezer. The frozen peas mold to your body better than a regular ice pack.  Vulvectomy, Care After  This sheet gives you information about how to care for yourself after your procedure. Your health care provider may also give you more specific instructions. If you have problems or questions, contact your health care provider. What can I expect after the procedure? After the procedure, it is common to have:  Vaginal pain.  Vaginal numbness.  Vaginal swelling.  Bloody vaginal discharge. Follow these instructions at home: Activity  Rest as told by your health care provider.  Do not lift, push, or pull more than 10 lb (2.3 kg), or the limit that you are told, until your health care provider says that it is safe.  Avoid activities that take a lot of effort for as long as told by your health care provider. This includes any exercise.  Raise (elevate) your legs while sitting or lying down.  Avoid standing or sitting in one place for long periods of time.  Do not Shonda Mandarino your legs, especially when sitting, until your health care provider approves.  Return to your normal activities as told by your health care provider. Ask your health care provider what activities are safe for you.   Bathing  Do not take baths, swim, or use a hot tub until your health care provider approves. Ask your health care provider if you can take showers. You may only be allowed to take sponge baths.  After passing urine or a bowel movement, wipe yourself from front to back and clean your vaginal area using a spray bottle.  If told by your health care provider, take a sitz bath to help with discomfort. This is a warm water bath you take while sitting  down. ? Do this 3-4 times a day, or as often as told by your health care provider. ? The water should only come up to your hips and cover your buttocks. ? You may pat the area dry with a soft, clean towel. ? If needed, you may then gently dry the area with a hair dryer on a cool setting for 5-10 minutes. An enclosed box fan may also be used to gently dry the area.   Incision care  Follow instructions from your health care provider about how to take care of your incision areas. Make sure you:  Leave stitches (sutures), skin glue, adhesive strips, or surgical clips in place. These skin closures may need to stay in place for 2 weeks or longer. If adhesive strip edges start to loosen and curl up, you may trim the loose edges.   Check your incision areas every day for signs of infection. It may be helpful to use a handheld mirror to do this. Check for: ? Redness, swelling, or pain that has gotten worse. ? More fluid or blood. ? Warmth. ? Pus or a bad smell.  If you were sent home with a drain, take care of it as told by your health care provider.   Lifestyle  Do not douche or use tampons until your health care provider approves.  Do not have sex until your health care provider approves. Tell your health care provider if you  have pain or numbness when you return to sexual activity.  Wear cotton underwear and comfortable, loose-fitting clothing. Medicines  Take over-the-counter and prescription medicines only as told by your health care provider.  Ask your health care provider if the medicine prescribed to you: ? Requires you to avoid driving or using heavy machinery. ? Can cause constipation. You may need to take these actions to prevent or treat constipation:  Take over-the-counter or prescription medicines.  Eat foods that are high in fiber, such as beans, whole grains, and fresh fruits and vegetables.  Limit foods that are high in fat and processed sugars, such as fried or sweet  foods. General instructions  Do not drive until your health care provider says that it is safe.  Drink enough fluid to keep your urine pale yellow.  Keep all follow-up visits as told by your health care provider. This is important. Contact a health care provider if:  You have any of these problems in the incision area: ? More redness, swelling, or pain around the incision. ? More fluid or blood coming from the incision. ? Pus or a bad smell coming from the incision. ? The incision feels warm to the touch. ? The incision breaks open.  You have a fever.  You have painful or bloody urination.  You feel nauseous or you vomit.  You have diarrhea.  You develop constipation.  You develop a rash.  You feel dizzy or light-headed.  You have pain that does not get better with medicine. Get help right away if you:  Faint.  Have leg or chest pain.  Have abdominal pain.  Have shortness of breath. Summary  After the procedure, it is common to have some pain, numbness, or swelling in the vaginal area. It is also common to have some bloody vaginal discharge.  Do not lift, push, or pull more than 5 lb (2.3 kg), or the limit that you are told, or engage in activities that take a lot of effort until your health care provider approves.  Follow instructions from your health care provider about how to take care of your incision.  Keep all follow-up visits as told by your health care provider. This is important. This information is not intended to replace advice given to you by your health care provider. Make sure you discuss any questions you have with your health care provider. Document Revised: 05/26/2018 Document Reviewed: 05/27/2018 Elsevier Patient Education  2021 [place].   Endometrial Biopsy  An endometrial biopsy is a procedure to remove tissue samples from the endometrium, which is the lining of the uterus. The tissue that is removed can then be checked under a  microscope for disease. This procedure is used to diagnose conditions such as endometrial cancer, endometrial tuberculosis, polyps, or other inflammatory conditions. This procedure may also be used to investigate uterine bleeding to determine where you are in your menstrual cycle or how your hormone levels are affecting the lining of the uterus. Tell a health care provider about:  Any allergies you have.  All medicines you are taking, including vitamins, herbs, eye drops, creams, and over-the-counter medicines.  Any problems you or family members have had with anesthetic medicines.  Any blood disorders you have.  Any surgeries you have had.  Any medical conditions you have.  Whether you are pregnant or may be pregnant. What are the risks? Generally, this is a safe procedure. However, problems may occur, including:  Bleeding.  Pelvic infection.  Puncture of the  wall of the uterus with the biopsy device (rare).  Allergic reactions to medicines. What happens before the procedure?  Keep a record of your menstrual cycles as told by your health care provider. You may need to schedule your procedure for a specific time in your cycle.  You may want to bring a sanitary pad to wear after the procedure.  Plan to have someone take you home from the hospital or clinic.  Ask your health care provider about: ? Changing or stopping your regular medicines. This is especially important if you are taking diabetes medicines, arthritis medicines, or blood thinners. ? Taking medicines such as aspirin and ibuprofen. These medicines can thin your blood. Do not take these medicines unless your health care provider tells you to take them. ? Taking over-the-counter medicines, vitamins, herbs, and supplements. What happens during the procedure?  You will lie on an exam table with your feet and legs supported as in a pelvic exam.  Your health care provider will insert an instrument (speculum) into your  vagina to see your cervix.  Your cervix will be cleansed with an antiseptic solution.  A medicine (local anesthetic) will be used to numb the cervix.  A forceps instrument (tenaculum) will be used to hold your cervix steady for the biopsy.  A thin, rod-like instrument (uterine sound) will be inserted through your cervix to determine the length of your uterus and the location where the biopsy sample will be removed.  A thin, flexible tube (catheter) will be inserted through your cervix and into the uterus. The catheter will be used to collect the biopsy sample from your endometrial tissue.  The catheter and speculum will then be removed, and the tissue sample will be sent to a lab for examination. The procedure may vary among health care providers and hospitals. What can I expect after procedure?  You will rest in a recovery area until you are ready to go home.  You may have mild cramping and a small amount of vaginal bleeding. This is normal.  You may have a small amount of vaginal bleeding for a few days. This is normal.  It is up to you to get the results of your procedure. Ask your health care provider, or the department that is doing the procedure, when your results will be ready. Follow these instructions at home:  Take over-the-counter and prescription medicines only as told by your health care provider.  Do not douche, use tampons, or have sexual intercourse until your health care provider approves.  Return to your normal activities as told by your health care provider. Ask your health care provider what activities are safe for you.  Follow instructions from your health care provider about any activity restrictions, such as restrictions on strenuous exercise or heavy lifting.  Keep all follow-up visits. This is important. Contact a health care provider:  You have heavy bleeding, or bleed for longer than 2 days after the procedure.  You have bad smelling discharge from  your vagina.  You have a fever or chills.  You have a burning sensation when urinating or you have difficulty urinating.  You have severe pain in your lower abdomen. Get help right away if you:  You have severe cramps in your stomach or back.  You pass large blood clots.  Your bleeding increases.  You become weak or light-headed, or you faint or lose consciousness. Summary  An endometrial biopsy is a procedure to remove tissue samples is taken from the endometrium,  which is the lining of the uterus.  The tissue sample that is removed will be checked under a microscope for disease.  This procedure is used to diagnose conditions such as endometrial cancer, endometrial tuberculosis, polyps, or other inflammatory conditions.  After the procedure, it is common to have mild cramping and a small amount of vaginal bleeding for a few days.  Do not douche, use tampons, or have sexual intercourse until your health care provider approves. Ask your health care provider which activities are safe for you. This information is not intended to replace advice given to you by your health care provider. Make sure you discuss any questions you have with your health care provider. Document Revised: 11/22/2019 Document Reviewed: 11/22/2019 Elsevier Patient Education  2021 [place].   Intrauterine Device Insertion, Care After  This sheet gives you information about how to care for yourself after your procedure. Your health care provider may also give you more specific instructions. If you have problems or questions, contact your health care provider. What can I expect after the procedure? After the procedure, it is common to have:  Cramps and pain in the abdomen.  Bleeding. It may be light or heavy. This may last for a few days.  Lower back pain.  Dizziness.  Headaches.  Nausea. Follow these instructions at home:  Before resuming sexual activity, check to make sure that you can feel  the IUD string or strings. You should be able to feel the end of the string below the opening of your cervix. If your IUD string is in place, you may resume sexual activity. ? If you had a hormonal IUD inserted more than 7 days after your most recent period started, you will need to use a backup method of birth control for 7 days after IUD insertion. Ask your health care provider whether this applies to you.  Continue to check that the IUD is still in place by feeling for the strings after every menstrual period, or once a month.  An IUD will not protect you from sexually transmitted infections (STIs). Use methods to prevent the exchange of body fluids between partners (barrier protection) every time you have sex. Barrier protection can be used during oral, vaginal, or anal sex. Commonly used barrier methods include: ? Female condom. ? Female condom. ? Dental dam.  Take over-the-counter and prescription medicines only as told by your health care provider.  Keep all follow-up visits as told by your health care provider. This is important.   Contact a health care provider if:  You feel light-headed or weak.  You have any of the following problems with your IUD string or strings: ? The string bothers or hurts you or your sexual partner. ? You cannot feel the string. ? The string has gotten longer.  You can feel the IUD in your vagina.  You think you may be pregnant, or you miss your menstrual period.  You think you may have a sexually transmitted infection (STI). Get help right away if:  You have flu-like symptoms, such as tiredness (fatigue) and muscle aches.  You have a fever and chills.  You have bleeding that is heavier or lasts longer than a normal menstrual cycle.  You have abnormal or bad-smelling discharge from your vagina.  You develop abdominal pain that is new, is getting worse, or is not in the same area of earlier cramping and pain.  You have pain during sexual  activity. Summary  After the procedure, it is  common to have cramps and pain in the abdomen. It is also common to have light bleeding or heavier bleeding that is like your menstrual period.  Continue to check that the IUD is still in place by feeling for the strings after every menstrual period, or once a month.  Keep all follow-up visits as told by your health care provider. This is important.  Contact your health care provider if you have problems with your IUD strings, such as the string getting longer or bothering you or your sexual partner. This information is not intended to replace advice given to you by your health care provider. Make sure you discuss any questions you have with your health care provider. Document Revised: 04/20/2019 Document Reviewed: 04/20/2019 Elsevier Patient Education  [place].

## 2020-06-29 NOTE — Anesthesia Preprocedure Evaluation (Addendum)
Anesthesia Evaluation  Patient identified by MRN, date of birth, ID band Patient awake    Reviewed: Allergy & Precautions, H&P , NPO status , Patient's Chart, lab work & pertinent test results, reviewed documented beta blocker date and time   Airway Mallampati: I  TM Distance: >3 FB Neck ROM: full    Dental no notable dental hx. (+) Dental Advisory Given, Missing, Poor Dentition, Chipped,    Pulmonary neg pulmonary ROS, Patient abstained from smoking., former smoker,    Pulmonary exam normal breath sounds clear to auscultation       Cardiovascular Exercise Tolerance: Good hypertension, Pt. on medications negative cardio ROS   Rhythm:regular Rate:Normal     Neuro/Psych negative neurological ROS  negative psych ROS   GI/Hepatic negative GI ROS, Neg liver ROS,   Endo/Other  Morbid obesity  Renal/GU negative Renal ROS  negative genitourinary   Musculoskeletal   Abdominal   Peds  Hematology  (+) Blood dyscrasia, anemia ,   Anesthesia Other Findings   Reproductive/Obstetrics negative OB ROS                            Anesthesia Physical Anesthesia Plan  ASA: III  Anesthesia Plan: General   Post-op Pain Management:    Induction: Intravenous  PONV Risk Score and Plan: 3 and Ondansetron and Dexamethasone  Airway Management Planned: LMA and Oral ETT  Additional Equipment:   Intra-op Plan:   Post-operative Plan: Extubation in OR  Informed Consent: I have reviewed the patients History and Physical, chart, labs and discussed the procedure including the risks, benefits and alternatives for the proposed anesthesia with the patient or authorized representative who has indicated his/her understanding and acceptance.     Dental Advisory Given  Plan Discussed with: CRNA and Anesthesiologist  Anesthesia Plan Comments: ( )       Anesthesia Quick Evaluation

## 2020-06-29 NOTE — Op Note (Signed)
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, AUB  Postoperative Diagnosis: same as above  Surgery: Partial simple left vulvectomy, endometrial biopsy  Surgeons:  Valarie Cones, MD  Assistant: Joylene John Np  Anesthesia: General   Estimated blood loss: 25 ml  IVF:  See I&O flowsheet   Urine output: 50 ml, spontaneous void  Complications: None apparent  Pathology: Left vulva with marking stitch at 12 o'clock  Mirena LOT#[id number], exp 02/2022  Operative findings: Minimal lack of uptake on the left vulva after application of Lugols (this encompassed what appeared to be one of the prior healing biopsy sites) - area of mildly decreased uptake was approximately 1x0.5cm. Uterus retroverted, cervix just behind the pubic symphysis. No able to easily appreciate the cervix on speculum exam. On bimanual, it is rather flush with the vagina. Uterus sounded to approximately 7cm.  Procedure: The patient was identified in the preoperative holding area. Informed consent was signed on the chart. Patient was seen history was reviewed and exam was performed.   The patient was then taken to the operating room and placed in the supine position with SCD hose on. General anesthesia was then induced without difficulty. She was then placed in the dorsolithotomy position. The perineum was prepped with Betadine. The vagina was prepped with Betadine. The patient was then draped after the prep was dried.   Timeout was performed the patient, procedure, antibiotic, allergy, and length of procedure. An EUA was performed. Attempt was made to visualize the cervix. Ultimately, the endometrial pipelle was passed over my hand and through the cervix until resistance met. Three passes were performed with minimal tissue noted. The Mirena IUD was then inserted to the fundus over the surgeon's hand, deployed, and the inserter removed. Strings were cut to 4cm. Vagina was  irrigated.  Lugols was then applied to the vulva with findings noted above. The lesion was identified and the marking pen was used to circumscribe the area with appropriate surgical margins. The subcuticular tissues were infiltrated with marcaine. The 15 blade scalpel was used to make an incision through the skin circumferentially as marked. The skin elipse was grasped and was separated from the underlying deep dermal tissues with the bovie device. After the specimen had been completely resected, it was oriented and marked at 12 o'clock with a 0-vicryl suture. The bovie was used to obtain hemostasis at the surgical bed. The subcutaneous tissues were irrigated and made hemostatic.   The deep dermal layer was approximated with 3-0vicryl mattress sutures to bring the skin edges into approximation and off tension. The wound was closed following langher's lines. The cutaneous layer was closed with interrupted 4-0 vicryl stitches and mattress sutures to ensure a tension free and hemostatic closure. The perineum was again irrigated. The foley was removed.  All instrument, suture, laparotomy, Ray-Tec, and needle counts were correct x2. The patient tolerated the procedure well and was taken recovery room in stable condition.   Jeral Pinch MD Gynecologic Oncology

## 2020-06-29 NOTE — Anesthesia Procedure Notes (Signed)
Date/Time: 06/29/2020 3:32 PM Performed by: Cynda Familia, CRNA Oxygen Delivery Method: Simple face mask Placement Confirmation: positive ETCO2 and breath sounds checked- equal and bilateral Dental Injury: Teeth and Oropharynx as per pre-operative assessment

## 2020-06-29 NOTE — Interval H&P Note (Signed)
History and Physical Interval Note:  06/29/2020 2:09 PM  Tamara Rhodes  has presented today for surgery, with the diagnosis of Vulvar intraepithelial neoplasia III, abnormal uterine bleeding.  The various methods of treatment have been discussed with the patient and family. After consideration of risks, benefits and other options for treatment, the patient has consented to  Procedure(s): WIDE EXCISION VULVECTOMY WITH POSSIBLE ENDOMETRIAL BIOPSY (N/A) INTRAUTERINE DEVICE (IUD) INSERTION MIRENA (N/A) POSSIBLE DILATATION AND CURETTAGE (N/A) as a surgical intervention.  The patient's history has been reviewed, patient examined, no change in status, stable for surgery.  I have reviewed the patient's chart and labs.  Questions were answered to the patient's satisfaction.     Lafonda Mosses

## 2020-06-29 NOTE — Anesthesia Procedure Notes (Signed)
Procedure Name: LMA Insertion Date/Time: 06/29/2020 2:46 PM Performed by: Cynda Familia, CRNA Pre-anesthesia Checklist: Patient identified, Emergency Drugs available, Suction available and Patient being monitored Patient Re-evaluated:Patient Re-evaluated prior to induction Oxygen Delivery Method: Circle System Utilized Preoxygenation: Pre-oxygenation with 100% oxygen Induction Type: IV induction Ventilation: Mask ventilation without difficulty LMA: LMA inserted and LMA with gastric port inserted LMA Size: 4.0 Number of attempts: 1 Airway Equipment and Method: Bite block Placement Confirmation: positive ETCO2 Tube secured with: Tape Dental Injury: Teeth and Oropharynx as per pre-operative assessment  Comments: Smooth IV induction Oddono-- LMA insertion AM CRNA atraumatic-- teeth and mouth as preop bilat BS

## 2020-06-30 ENCOUNTER — Telehealth: Payer: Self-pay

## 2020-06-30 ENCOUNTER — Encounter (HOSPITAL_COMMUNITY): Payer: Self-pay | Admitting: Gynecologic Oncology

## 2020-06-30 NOTE — Telephone Encounter (Signed)
Post op call made.  Patient stated she is doing well with minimal soreness.  Patient stated she is up and walking around, eating and drinking without difficulty.  No issues with bowel or bladder.  She is using squirt water bottle for toileting.  Post surgical precautions reviewed with patient.  Patient appointment changed 2/2 conflict with patient's work schedule.  Patient will call with any questions or concerns.

## 2020-07-05 ENCOUNTER — Telehealth: Payer: Self-pay | Admitting: *Deleted

## 2020-07-05 NOTE — Telephone Encounter (Signed)
Patient called and stated "I had surgery last week with Dr Berline Lopes. I have some questions about FMLA and after care." Explained that someone will call her back.

## 2020-07-05 NOTE — Telephone Encounter (Signed)
Tamara Rhodes asked if it was ok to use diluted peroxide in peri bottle to clean vulva after tolieting. Told her that she just needed to use water to dilute urine and keep it from burning the vulvar area. The peroxide would irritate the wound and interfere with healing.  She stated that she will stop using the peroxide.  Told her to use soap and water and pat dry. Pt also brought FMLA papers to the office to be completed. They need to be sent to her employer by 07-19-20.

## 2020-07-06 ENCOUNTER — Telehealth: Payer: Self-pay | Admitting: *Deleted

## 2020-07-06 ENCOUNTER — Telehealth: Payer: Self-pay

## 2020-07-06 LAB — SURGICAL PATHOLOGY

## 2020-07-06 NOTE — Telephone Encounter (Signed)
Returned patient call.  Patient stated she began having vaginal bleeding Sunday, February 20th.  Ms. Bradt stated it is time for her period but wanted to make sure this is normal.  Reassured patient it is likely her period and it is not unusual after Mirena placement to have irregular cycles for awhile.  Patient verbalized agreement and understanding.  Patient asked if it is okay to use dilute peroxide in water bottle to clean vulvar surgical site.  Patient denied any particular reason for wanting to use peroxide but felt it was cleansing.   Patient advised against using peroxide.  Patient is waiting to hear back from her HR regarding FMLA paperwork.

## 2020-07-06 NOTE — Telephone Encounter (Signed)
Called the patient to receive the fax number to send her FMLA. Patient the asked "I started bleeding on Sunday, not sure if that is normal after my surgery. I still have my cycle and it's but time to start. I did start a couple days early. The bleeding is the same since Sunday." Explained to the patient that the message will be given to the nurse and someone will call her back. Patient will also try to have the fax number for her FMLA.

## 2020-07-07 ENCOUNTER — Telehealth: Payer: Self-pay

## 2020-07-07 ENCOUNTER — Telehealth: Payer: Self-pay | Admitting: *Deleted

## 2020-07-07 NOTE — Telephone Encounter (Signed)
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 These cell are seen in the cervix.  This cell were probably obtained when bx instrument came out from the uterus through the cervix.  The cells are precancerous so she will need continued surveillance to monitor for more changes in the cells that would indicate that there is more of a chance of cancer or a cancer is present in the cervix and would need further treatment. Dr. Berline Lopes will discuss the pathology and recommendations at her post op visit on 07-20-20 Tamara Rhodes verbalized  understanding.

## 2020-07-07 NOTE — Telephone Encounter (Signed)
Late entry-----emailed patient FMLA paperwork yesterday

## 2020-07-10 ENCOUNTER — Telehealth: Payer: Self-pay | Admitting: *Deleted

## 2020-07-10 ENCOUNTER — Telehealth: Payer: Self-pay

## 2020-07-10 NOTE — Telephone Encounter (Signed)
Patient called and stated "I had surgery on 2/17 and I'm suppose to go back to work today. I feel I need more time. I'm having a lot pain. I guess I thought the surgery would be like the biopsy and it wasn't. I sit a lot at work and I work 10 hour shifts. It is very uncomfortable to sit, so I walk a lot or I'm lying down." Explained that I would have the nurse call her back

## 2020-07-10 NOTE — Telephone Encounter (Signed)
Returned patient call.  Patient is having some difficulty sitting for long periods, request extension of time off until 07/16/2020.  Approved by Dr. Berline Lopes to extend and return to work 07/16/2020.  Patient reports bleeding stopped 2 days ago and today has had some spotting.  Reassured patient this could be sutures dissolving or Mirena IUD causing some irregular spotting/bleeding. FMLA forms adjusted to reflect return to work 07/16/2020.  Patient is to keep appointment with Dr. Berline Lopes on 07/20/2020. Patient verbalized understanding to all information provided.

## 2020-07-20 ENCOUNTER — Other Ambulatory Visit: Payer: Self-pay

## 2020-07-20 ENCOUNTER — Inpatient Hospital Stay: Payer: Medicaid Other | Attending: Gynecologic Oncology | Admitting: Gynecologic Oncology

## 2020-07-20 VITALS — BP 155/88 | HR 91 | Temp 97.0°F | Resp 18

## 2020-07-20 DIAGNOSIS — Z9079 Acquired absence of other genital organ(s): Secondary | ICD-10-CM

## 2020-07-20 DIAGNOSIS — D071 Carcinoma in situ of vulva: Secondary | ICD-10-CM

## 2020-07-20 DIAGNOSIS — Z975 Presence of (intrauterine) contraceptive device: Secondary | ICD-10-CM | POA: Insufficient documentation

## 2020-07-20 DIAGNOSIS — Z87891 Personal history of nicotine dependence: Secondary | ICD-10-CM | POA: Insufficient documentation

## 2020-07-20 DIAGNOSIS — Z79899 Other long term (current) drug therapy: Secondary | ICD-10-CM | POA: Insufficient documentation

## 2020-07-20 DIAGNOSIS — I10 Essential (primary) hypertension: Secondary | ICD-10-CM | POA: Insufficient documentation

## 2020-07-20 DIAGNOSIS — E669 Obesity, unspecified: Secondary | ICD-10-CM | POA: Insufficient documentation

## 2020-07-20 DIAGNOSIS — N939 Abnormal uterine and vaginal bleeding, unspecified: Secondary | ICD-10-CM

## 2020-07-20 NOTE — Patient Instructions (Signed)
I will see you in 4-6 weeks for repeat exam.  In the meantime, keep using the squirt bottle after you go to the bathroom and do 1-2 sitz bath's a day.  Please call the office if you have any fevers, chills, drainage that looks like pus, or increasing pain.

## 2020-07-20 NOTE — Progress Notes (Signed)
Gynecologic Oncology Return Clinic Visit  07/20/20  Reason for Visit: follow-up after surgery  Treatment History: 06/29/20: partial simple left vulvectomy (in the setting of VIN3), endometrial biopsy, Mirena IUD insertion for AUB  Interval History: The patient presents today for follow-up. She endorses doing very well for several days after surgery. She then went back to work and was pending and moving around a lot. She felt a "tearing" sensation between her labia and has had episodes where she feels as if she has a tampon that is falling out.  Bleeding has been intermittent with some passage of clots. She denies any discharge. She reports normal bowel function and is still using senna.  She denies any urinary symptoms.  She endorses a good appetite without nausea or emesis.  She denies fevers or chills.  Past Medical/Surgical History: Past Medical History:  Diagnosis Date   Anemia    Desmoid fibromatosis    Hypertension    Obesity    VIN III (vulvar intraepithelial neoplasia III)     Past Surgical History:  Procedure Laterality Date   CESAREAN SECTION     pt had had two   DILATION AND CURETTAGE OF UTERUS N/A 06/29/2020   Procedure: POSSIBLE DILATATION AND CURETTAGE;  Surgeon: Lafonda Mosses, MD;  Location: [facility];  Service: Gynecology;  Laterality: N/A;   INTRAUTERINE DEVICE (IUD) INSERTION N/A 06/29/2020   Procedure: INTRAUTERINE DEVICE (IUD) INSERTION MIRENA;  Surgeon: Lafonda Mosses, MD;  Location: [facility];  Service: Gynecology;  Laterality: N/A;   SOFT TISSUE TUMOR RESECTION  2014   from back   TUBAL LIGATION     16 years ago with c section   VULVECTOMY N/A 06/29/2020   Procedure: WIDE EXCISION VULVECTOMY WITH POSSIBLE ENDOMETRIAL BIOPSY;  Surgeon: Lafonda Mosses, MD;  Location: [facility];  Service: Gynecology;  Laterality: N/A;    Family History  Problem Relation Age of Onset   Osteoarthritis Mother    Hypertension Sister    Diabetes Father     Hypertension Father    Renal Disease Father    Cancer - Other Maternal Grandfather        Throat Cancer   Cancer - Prostate Maternal Uncle    Breast cancer Neg Hx    Endometrial cancer Neg Hx    Pancreatic cancer Neg Hx    Colon cancer Neg Hx     Social History   Socioeconomic History   Marital status: Single    Spouse name: Not on file   Number of children: Not on file   Years of education: Not on file   Highest education level: Not on file  Occupational History   Occupation: admin clerk  Tobacco Use   Smoking status: Former Smoker    Quit date: 06/18/2020    Years since quitting: 0.0   Smokeless tobacco: Never Used   Tobacco comment: was smoking black and milds  Vaping Use   Vaping Use: Some days  Substance and Sexual Activity   Alcohol use: Never   Drug use: Yes    Frequency: 2.0 times per week    Types: Marijuana   Sexual activity: Yes    Partners: Male    Birth control/protection: Surgical  Other Topics Concern   Not on file  Social History Narrative   Not on file   Social Determinants of Health   Financial Resource Strain: Not on file  Food Insecurity: No Food Insecurity   Worried About Running Out of Food in the Last  Year: Never true   [place] in the Last Year: Never true  Transportation Needs: No Transportation Needs   Lack of Transportation (Medical): No   Lack of Transportation (Non-Medical): No  Physical Activity: Not on file  Stress: Not on file  Social Connections: Not on file    Current Medications:  Current Outpatient Medications:    acetaminophen (TYLENOL) 500 MG tablet, Take 1,000 mg by mouth every 6 (six) hours as needed for moderate pain or headache., Disp: , Rfl:    amLODipine (NORVASC) 5 MG tablet, Take 1 tablet by mouth daily., Disp: , Rfl:    hydrOXYzine (ATARAX/VISTARIL) 25 MG tablet, Take 25 mg by mouth at bedtime as needed (sleep)., Disp: , Rfl:    ibuprofen (ADVIL) 800 MG tablet, Take 1 tablet  (800 mg total) by mouth every 8 (eight) hours as needed for moderate pain. For AFTER surgery, Disp: 30 tablet, Rfl: 0   oxyCODONE (OXY IR/ROXICODONE) 5 MG immediate release tablet, Take 1 tablet (5 mg total) by mouth every 4 (four) hours as needed for severe pain. For AFTER surgery, do not take and drive, Disp: 15 tablet, Rfl: 0   senna-docusate (SENOKOT-S) 8.6-50 MG tablet, Take 2 tablets by mouth at bedtime. For AFTER surgery, do not take if having diarrhea, Disp: 30 tablet, Rfl: 0   tranexamic acid (LYSTEDA) 650 MG TABS tablet, Take 2 tablets (1,300 mg total) by mouth 3 (three) times daily. Take during menses for a maximum of five days, Disp: 30 tablet, Rfl: 4  Review of Systems: Pertinent positives as per HPI. Denies appetite changes, fevers, chills, fatigue, unexplained weight changes. Denies hearing loss, neck lumps or masses, mouth sores, ringing in ears or voice changes. Denies cough or wheezing.  Denies shortness of breath. Denies chest pain or palpitations. Denies leg swelling. Denies abdominal distention, pain, blood in stools, constipation, diarrhea, nausea, vomiting, or early satiety. Denies pain with intercourse, dysuria, frequency, hematuria or incontinence.   Denies joint pain, back pain or muscle pain/cramps. Denies itching, rash, or wounds. Denies dizziness, headaches, numbness or seizures. Denies swollen lymph nodes or glands, denies easy bruising or bleeding. Denies anxiety, depression, confusion, or decreased concentration.  Physical Exam: BP (!) 155/88 (BP Location: Left Arm, Patient Position: Sitting)    Pulse 91    Temp (!) 97 F (36.1 C) (Tympanic)    Resp 18    SpO2 100%  General: Alert, oriented, no acute distress. HEENT: Atraumatic, normocephalic, sclera anicteric. Chest: Unlabored breathing on room aire. Extremities: Grossly normal range of motion.  Warm, well perfused.  No edema bilaterally. GU: Normal appearing external genitalia without erythema,  excoriation, or lesions.  Upon separation of the labia, the incision is noted to have opened entirely along its length. The defect is shallow with some fibrinous appearing tissue. No erythema or exudate. The tissue is tender to palpation.   Laboratory & Radiologic Studies: A. ENDOMETRIAL, PERINEUM, BIOPSY:  - Mucoinflammatory debris with dysplastic squamous epithelium  - Benign lower uterine segment and endocervical tissue  - No malignancy identified  - See comment   B. VULVA, LEFT, PERINEUM, BIOPSY:  - Reactive squamous epithelium  - See comment   Assessment & Plan: Tamara Rhodes is a 40 y.o. 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  The patient is overall doing well from a post-op standpoint. We discussed that increased activity immediately after surgery may have contributed to dehiscence of her superficial incision. There is no evidence of infection and we reviewed that this will now heal by secondary intent. I reviewed signs and symptoms that would be concerning for infection and she knows to call if any develop. I will plan to see her back in 4-6 weeks for repeat exam.  I suspect her intermittent bleeding is related to the IUD placement. I reviewed her exam at surgery and that the location of her cervix behind her symphysis makes visualization very challenging (I had to do the biopsy and IUD insertion blindly). Given EMB pathology, I recommend that she have cotesting in a year (may have to be a blind pap).  28 minutes of total time was spent for this patient encounter, including preparation, face-to-face counseling with the patient and coordination of care, and documentation of the encounter.  Jeral Pinch, MD  Division of Gynecologic  Oncology  Department of Obstetrics and Gynecology  [facility]

## 2020-07-21 ENCOUNTER — Telehealth: Payer: Self-pay | Admitting: Oncology

## 2020-07-21 ENCOUNTER — Encounter: Payer: Self-pay | Admitting: Gynecologic Oncology

## 2020-07-21 NOTE — Telephone Encounter (Signed)
Tamara Rhodes has been cld and schedule to see Dr. Alen Blew on 3/25 at 2pm. Pt aware to arrive 20 minutes early.

## 2020-07-26 ENCOUNTER — Encounter: Payer: Medicaid Other | Admitting: Gynecologic Oncology

## 2020-08-04 ENCOUNTER — Inpatient Hospital Stay (HOSPITAL_BASED_OUTPATIENT_CLINIC_OR_DEPARTMENT_OTHER): Payer: Medicaid Other | Admitting: Oncology

## 2020-08-04 ENCOUNTER — Other Ambulatory Visit: Payer: Self-pay

## 2020-08-04 DIAGNOSIS — D071 Carcinoma in situ of vulva: Secondary | ICD-10-CM | POA: Diagnosis present

## 2020-08-04 DIAGNOSIS — I10 Essential (primary) hypertension: Secondary | ICD-10-CM | POA: Diagnosis not present

## 2020-08-04 DIAGNOSIS — E669 Obesity, unspecified: Secondary | ICD-10-CM | POA: Diagnosis not present

## 2020-08-04 DIAGNOSIS — Z79899 Other long term (current) drug therapy: Secondary | ICD-10-CM | POA: Diagnosis not present

## 2020-08-04 DIAGNOSIS — D481 Neoplasm of uncertain behavior of connective and other soft tissue: Secondary | ICD-10-CM

## 2020-08-04 DIAGNOSIS — Z975 Presence of (intrauterine) contraceptive device: Secondary | ICD-10-CM | POA: Diagnosis not present

## 2020-08-04 DIAGNOSIS — Z87891 Personal history of nicotine dependence: Secondary | ICD-10-CM | POA: Diagnosis not present

## 2020-08-04 NOTE — Progress Notes (Signed)
Reason for the request:    Desmoid tumor  HPI: I was asked by Park Meo, PA-C   to evaluate Tamara Rhodes for the evaluation of desmoid tumor.She is a 40 year old woman with history of hypertension who was found to have a tumor on her back in 2014.  She presented with pain and a mass at the midline of her back in 2013.  She is subsequently underwent evaluation at [facility] and MRI showed an enlarging solid mass involving the spinous process of T10 and L1.  Core biopsy showed the presence of desmoid tumor and subsequently underwent underwent surgical resection performed by Dr. Nydia Bouton in November 2014.  He final pathology showed a mass measuring 11.5 cm desmoid fibromatosis with positive margins.  She remained on active surveillance since that time with her last MRI completed in 2016 without any suspicious for recurrent disease.  She was evaluated by Dr. Kendall Flack at [facility] in 2021 but was not able to follow regularly since that time.  She was referred here for evaluation and follow-up.  Clinically, he reports feeling well without any major complaints.  She denies that chronic Back pain which has not changed since her operation.  She denies any groin masses or tumors from around the surgical excision.  She is currently living in [place] after relocating here for the last 3 years.  She has undergone a simple left vulvectomy completed by Dr. Chari Manning completed in February 2022 and currently has follow-up locally for that.  He has no residual complaints from her surgery.    She does not report any headaches, blurry vision, syncope or seizures. Does not report any fevers, chills or sweats.  Does not report any cough, wheezing or hemoptysis.  Does not report any chest pain, palpitation, orthopnea or leg edema.  Does not report any nausea, vomiting or abdominal pain.  Does not report any constipation or diarrhea.  Does not report any skeletal complaints.    Does not  report frequency, urgency or hematuria.  Does not report any skin rashes or lesions. Does not report any heat or cold intolerance.  Does not report any lymphadenopathy or petechiae.  Does not report any anxiety or depression.  Remaining review of systems is negative.    Past Medical History:  Diagnosis Date  . Anemia   . Desmoid fibromatosis   . Hypertension   . Obesity   . VIN III (vulvar intraepithelial neoplasia III)   :  Past Surgical History:  Procedure Laterality Date  . CESAREAN SECTION     pt had had two  . DILATION AND CURETTAGE OF UTERUS N/A 06/29/2020   Procedure: POSSIBLE DILATATION AND CURETTAGE;  Surgeon: Lafonda Mosses, MD;  Location: [facility];  Service: Gynecology;  Laterality: N/A;  . INTRAUTERINE DEVICE (IUD) INSERTION N/A 06/29/2020   Procedure: INTRAUTERINE DEVICE (IUD) INSERTION MIRENA;  Surgeon: Lafonda Mosses, MD;  Location: [facility];  Service: Gynecology;  Laterality: N/A;  . SOFT TISSUE TUMOR RESECTION  2014   from back  . TUBAL LIGATION     16 years ago with c section  . VULVECTOMY N/A 06/29/2020   Procedure: WIDE EXCISION VULVECTOMY WITH POSSIBLE ENDOMETRIAL BIOPSY;  Surgeon: Lafonda Mosses, MD;  Location: [facility];  Service: Gynecology;  Laterality: N/A;  :   Current Outpatient Medications:  .  acetaminophen (TYLENOL) 500 MG tablet, Take 1,000 mg by mouth every 6 (six) hours as needed for moderate pain or headache., Disp: , Rfl:  .  amLODipine (NORVASC) 5 MG tablet, Take 1 tablet by mouth daily., Disp: , Rfl:  .  hydrOXYzine (ATARAX/VISTARIL) 25 MG tablet, Take 25 mg by mouth at bedtime as needed (sleep)., Disp: , Rfl:  .  ibuprofen (ADVIL) 800 MG tablet, Take 1 tablet (800 mg total) by mouth every 8 (eight) hours as needed for moderate pain. For AFTER surgery, Disp: 30 tablet, Rfl: 0 .  oxyCODONE (OXY IR/ROXICODONE) 5 MG immediate release tablet, Take 1 tablet (5 mg total) by mouth every 4 (four) hours as needed for severe pain. For AFTER surgery,  do not take and drive, Disp: 15 tablet, Rfl: 0 .  senna-docusate (SENOKOT-S) 8.6-50 MG tablet, Take 2 tablets by mouth at bedtime. For AFTER surgery, do not take if having diarrhea, Disp: 30 tablet, Rfl: 0 .  tranexamic acid (LYSTEDA) 650 MG TABS tablet, Take 2 tablets (1,300 mg total) by mouth 3 (three) times daily. Take during menses for a maximum of five days, Disp: 30 tablet, Rfl: 4:  Allergies  Allergen Reactions  . Penicillins Other (See Comments)    Yeast infection  :  Family History  Problem Relation Age of Onset  . Osteoarthritis Mother   . Hypertension Sister   . Diabetes Father   . Hypertension Father   . Renal Disease Father   . Cancer - Other Maternal Grandfather        Throat Cancer  . Cancer - Prostate Maternal Uncle   . Breast cancer Neg Hx   . Endometrial cancer Neg Hx   . Pancreatic cancer Neg Hx   . Colon cancer Neg Hx   :  Social History   Socioeconomic History  . Marital status: Single    Spouse name: Not on file  . Number of children: Not on file  . Years of education: Not on file  . Highest education level: Not on file  Occupational History  . Occupation: Cytogeneticist  Tobacco Use  . Smoking status: Former Smoker    Quit date: 06/18/2020    Years since quitting: 0.1  . Smokeless tobacco: Never Used  . Tobacco comment: was smoking black and milds  Vaping Use  . Vaping Use: Some days  Substance and Sexual Activity  . Alcohol use: Never  . Drug use: Yes    Frequency: 2.0 times per week    Types: Marijuana  . Sexual activity: Yes    Partners: Male    Birth control/protection: Surgical  Other Topics Concern  . Not on file  Social History Narrative  . Not on file   Social Determinants of Health   Financial Resource Strain: Not on file  Food Insecurity: No Food Insecurity  . Worried About Charity fundraiser in the Last Year: Never true  . Ran Out of Food in the Last Year: Never true  Transportation Needs: No Transportation Needs  . Lack of  Transportation (Medical): No  . Lack of Transportation (Non-Medical): No  Physical Activity: Not on file  Stress: Not on file  Social Connections: Not on file  Intimate Partner Violence: Not on file  :  Pertinent items are noted in HPI.  Exam: ECOG 0  General appearance: alert and cooperative appeared without distress. Head: atraumatic without any abnormalities. Eyes: conjunctivae/corneas clear. PERRL.  Sclera anicteric. Throat: lips, mucosa, and tongue normal; without oral thrush or ulcers. Resp: clear to auscultation bilaterally without rhonchi, wheezes or dullness to percussion. Cardio: regular rate and rhythm, S1, S2 normal, no murmur, click, rub or  gallop GI: soft, non-tender; bowel sounds normal; no masses,  no organomegaly Skin: Inspection of her skin on the back did not show any protrusion or masses.  Surgical scar appears well-healed in the mid back. Lymph nodes: Cervical, supraclavicular, and axillary nodes normal. Neurologic: Grossly normal without any motor, sensory or deep tendon reflexes. Musculoskeletal: No joint deformity or effusion.   Assessment and Plan:   40 year old woman with:  1.  Desmoid fibromatosis of the back diagnosed in 2014.  She presented with a mass measuring 11.5 cm that was surgically resected at [facility] and has not required any treatment since that time.  She has been erratic in her follow-up since that time.  The natural course of this disease was described and treatment options moving forward were reviewed.  This entity remains a low-grade tumor that has propensity to locally advanced but not metastasize.  Repeat surgical resection, radiation therapy versus systemic therapy options were reviewed if she developed relapsed disease.  At this time, I recommend updating her staging MRI of the thoracic and lumbar spine if no evidence of disease is detected I would recommend to repeat imaging studies as needed.  She is agreeable  with this plan.  2.  Follow-up: Will be in 1 year for repeat evaluation sooner after MRI is abnormal.   60  minutes were dedicated to this visit. The time was spent on reviewing imaging studies, discussing treatment options, discussing differential diagnosis and answering questions regarding future plan.     A copy of this consult has been forwarded to the requesting physician.

## 2020-08-17 ENCOUNTER — Encounter: Payer: Self-pay | Admitting: General Practice

## 2020-08-19 ENCOUNTER — Ambulatory Visit (HOSPITAL_COMMUNITY): Admission: RE | Admit: 2020-08-19 | Payer: Medicaid Other | Source: Ambulatory Visit

## 2020-08-23 ENCOUNTER — Telehealth: Payer: Self-pay | Admitting: *Deleted

## 2020-08-23 NOTE — Telephone Encounter (Signed)
PC to patient regarding meaningful use questions for tomorrow's MD appointment.  Left message for patient to call office [phone].

## 2020-08-24 ENCOUNTER — Other Ambulatory Visit: Payer: Self-pay

## 2020-08-24 ENCOUNTER — Inpatient Hospital Stay: Payer: Medicaid Other | Attending: Gynecologic Oncology | Admitting: Gynecologic Oncology

## 2020-08-24 ENCOUNTER — Encounter: Payer: Self-pay | Admitting: Gynecologic Oncology

## 2020-08-24 VITALS — BP 136/87 | HR 73 | Temp 96.5°F | Resp 20 | Wt 269.2 lb

## 2020-08-24 DIAGNOSIS — D071 Carcinoma in situ of vulva: Secondary | ICD-10-CM

## 2020-08-24 DIAGNOSIS — Z79899 Other long term (current) drug therapy: Secondary | ICD-10-CM | POA: Diagnosis not present

## 2020-08-24 DIAGNOSIS — E669 Obesity, unspecified: Secondary | ICD-10-CM | POA: Insufficient documentation

## 2020-08-24 DIAGNOSIS — Z9079 Acquired absence of other genital organ(s): Secondary | ICD-10-CM | POA: Diagnosis not present

## 2020-08-24 DIAGNOSIS — I10 Essential (primary) hypertension: Secondary | ICD-10-CM | POA: Diagnosis not present

## 2020-08-24 DIAGNOSIS — N939 Abnormal uterine and vaginal bleeding, unspecified: Secondary | ICD-10-CM

## 2020-08-24 NOTE — Progress Notes (Signed)
Gynecologic Oncology Return Clinic Visit  08/24/20  Reason for Visit: Incision check  Treatment History: 06/29/20: partial simple left vulvectomy (in the setting of VIN3), endometrial biopsy, Mirena IUD insertion for AUB  Interval History: Patient presents today for an incision check.  She is approximately 2 months status post a partial simple vulvectomy as well as endometrial biopsy and Mirena IUD insertion.  She reports overall doing well.  She is having continued bleeding although for the last week it has been minimal and dark brown.  She denies any vulvar pain or discharge.  She reports regular bowel and bladder function  Past Medical/Surgical History: Past Medical History:  Diagnosis Date  . Anemia   . Desmoid fibromatosis   . Hypertension   . Obesity   . VIN III (vulvar intraepithelial neoplasia III)     Past Surgical History:  Procedure Laterality Date  . CESAREAN SECTION     pt had had two  . DILATION AND CURETTAGE OF UTERUS N/A 06/29/2020   Procedure: POSSIBLE DILATATION AND CURETTAGE;  Surgeon: Lafonda Mosses, MD;  Location: [facility];  Service: Gynecology;  Laterality: N/A;  . INTRAUTERINE DEVICE (IUD) INSERTION N/A 06/29/2020   Procedure: INTRAUTERINE DEVICE (IUD) INSERTION MIRENA;  Surgeon: Lafonda Mosses, MD;  Location: [facility];  Service: Gynecology;  Laterality: N/A;  . SOFT TISSUE TUMOR RESECTION  2014   from back  . TUBAL LIGATION     16 years ago with c section  . VULVECTOMY N/A 06/29/2020   Procedure: WIDE EXCISION VULVECTOMY WITH POSSIBLE ENDOMETRIAL BIOPSY;  Surgeon: Lafonda Mosses, MD;  Location: [facility];  Service: Gynecology;  Laterality: N/A;    Family History  Problem Relation Age of Onset  . Osteoarthritis Mother   . Hypertension Sister   . Diabetes Father   . Hypertension Father   . Renal Disease Father   . Cancer - Other Maternal Grandfather        Throat Cancer  . Cancer - Prostate Maternal Uncle   . Breast cancer Neg Hx   .  Endometrial cancer Neg Hx   . Pancreatic cancer Neg Hx   . Colon cancer Neg Hx     Social History   Socioeconomic History  . Marital status: Single    Spouse name: Not on file  . Number of children: Not on file  . Years of education: Not on file  . Highest education level: Not on file  Occupational History  . Occupation: Cytogeneticist  Tobacco Use  . Smoking status: Former Smoker    Quit date: 06/18/2020    Years since quitting: 0.1  . Smokeless tobacco: Never Used  . Tobacco comment: was smoking black and milds  Vaping Use  . Vaping Use: Some days  Substance and Sexual Activity  . Alcohol use: Never  . Drug use: Yes    Frequency: 2.0 times per week    Types: Marijuana  . Sexual activity: Yes    Partners: Male    Birth control/protection: Surgical  Other Topics Concern  . Not on file  Social History Narrative  . Not on file   Social Determinants of Health   Financial Resource Strain: Not on file  Food Insecurity: No Food Insecurity  . Worried About Charity fundraiser in the Last Year: Never true  . Ran Out of Food in the Last Year: Never true  Transportation Needs: No Transportation Needs  . Lack of Transportation (Medical): No  . Lack of Transportation (Non-Medical):  No  Physical Activity: Not on file  Stress: Not on file  Social Connections: Not on file    Current Medications:  Current Outpatient Medications:  .  acetaminophen (TYLENOL) 500 MG tablet, Take 1,000 mg by mouth every 6 (six) hours as needed for moderate pain or headache., Disp: , Rfl:  .  amLODipine (NORVASC) 5 MG tablet, Take 1 tablet by mouth daily., Disp: , Rfl:  .  hydrOXYzine (ATARAX/VISTARIL) 25 MG tablet, Take 25 mg by mouth at bedtime as needed (sleep)., Disp: , Rfl:  .  ibuprofen (ADVIL) 800 MG tablet, Take 1 tablet (800 mg total) by mouth every 8 (eight) hours as needed for moderate pain. For AFTER surgery, Disp: 30 tablet, Rfl: 0 .  oxyCODONE (OXY IR/ROXICODONE) 5 MG immediate release  tablet, Take 1 tablet (5 mg total) by mouth every 4 (four) hours as needed for severe pain. For AFTER surgery, do not take and drive, Disp: 15 tablet, Rfl: 0 .  senna-docusate (SENOKOT-S) 8.6-50 MG tablet, Take 2 tablets by mouth at bedtime. For AFTER surgery, do not take if having diarrhea (Patient not taking: Reported on 08/24/2020), Disp: 30 tablet, Rfl: 0 .  tranexamic acid (LYSTEDA) 650 MG TABS tablet, Take 2 tablets (1,300 mg total) by mouth 3 (three) times daily. Take during menses for a maximum of five days (Patient not taking: Reported on 08/24/2020), Disp: 30 tablet, Rfl: 4  Review of Systems: Denies appetite changes, fevers, chills, fatigue, unexplained weight changes. Denies hearing loss, neck lumps or masses, mouth sores, ringing in ears or voice changes. Denies cough or wheezing.  Denies shortness of breath. Denies chest pain or palpitations. Denies leg swelling. Denies abdominal distention, pain, blood in stools, constipation, diarrhea, nausea, vomiting, or early satiety. Denies pain with intercourse, dysuria, frequency, hematuria or incontinence. Denies hot flashes, pelvic pain or vaginal discharge.   Denies joint pain, back pain or muscle pain/cramps. Denies itching, rash, or wounds. Denies dizziness, headaches, numbness or seizures. Denies swollen lymph nodes or glands, denies easy bruising or bleeding. Denies anxiety, depression, confusion, or decreased concentration.  Physical Exam: BP 136/87 (BP Location: Left Arm, Patient Position: Sitting)   Pulse 73   Temp (!) 96.5 F (35.8 C) (Tympanic)   Resp 20   Wt 269 lb 4 oz (122.1 kg)   SpO2 100%   BMI 52.58 kg/m  General: Alert, oriented, no acute distress. HEENT: Normocephalic, atraumatic, sclera anicteric. Chest: Unlabored breathing on room air. Extremities: Grossly normal range of motion.  Warm, well perfused.  No edema bilaterally. Skin: No rashes or lesions noted. GU: Normal appearing external genitalia without  erythema, excoriation, or lesions.  Left vulvar incision has almost completely healed, there is a minor area of desquamation in the mid vulva, otherwise no erythema, tenderness or drainage.  Laboratory & Radiologic Studies: None new  Assessment & Plan: Tamara Rhodes is a 40 y.o. woman with vulvar dysplasia as well as abnormal uterine bleeding who is 2 months status post partial simple left vulvectomy as well as Mirena IUD insertion and endometrial biopsy.  Patient is continuing to heal well and her vulvar incision is almost completely healed.  We discussed doing her activity restrictions.  In terms of her bleeding, we discussed that there can be some abnormal uterine bleeding for the first 3 to 6 months after Mirena IUD placement.  I have asked her to call me if she has any issues related to her IUD.  Otherwise, I recommend that she have cotesting in 1 year  with her gynecologist.  28 minutes of total time was spent for this patient encounter, including preparation, face-to-face counseling with the patient and coordination of care, and documentation of the encounter.  Jeral Pinch, MD  Division of Gynecologic Oncology  Department of Obstetrics and Gynecology  [facility]

## 2020-08-24 NOTE — Patient Instructions (Signed)
You are healing very well!  There is still a small area that is in the process of healing, but otherwise the tissue looks healed.  I suspect that the bleeding you are having is related to the IUD, hopefully this will improve over the next couple of months.  Please call me if you have any issues related to the IUD in the future.  I am recommending that you see your gynecologist in a year for a Pap test and HPV testing.

## 2020-08-26 ENCOUNTER — Ambulatory Visit (HOSPITAL_COMMUNITY)
Admission: RE | Admit: 2020-08-26 | Discharge: 2020-08-26 | Disposition: A | Discharge status: Home / Self Care | Payer: Medicaid Other | Source: Ambulatory Visit | Attending: Oncology | Admitting: Oncology

## 2020-08-26 ENCOUNTER — Other Ambulatory Visit: Payer: Self-pay

## 2020-08-26 DIAGNOSIS — D481 Neoplasm of uncertain behavior of connective and other soft tissue: Secondary | ICD-10-CM

## 2020-08-26 MED ORDER — GADOBUTROL 1 MMOL/ML IV SOLN
10.0000 mL | Freq: Once | INTRAVENOUS | Status: AC | PRN
  Administered 2020-08-26: 10 mL via INTRAVENOUS

## 2020-09-11 ENCOUNTER — Telehealth: Payer: Self-pay | Admitting: *Deleted

## 2020-09-11 NOTE — Telephone Encounter (Signed)
Received vm message from patient asking about the results of her recent MRI

## 2020-09-12 ENCOUNTER — Telehealth: Payer: Self-pay | Admitting: *Deleted

## 2020-09-12 NOTE — Telephone Encounter (Signed)
Please let her know MRI is normal

## 2020-09-12 NOTE — Telephone Encounter (Signed)
Opened in error

## 2020-09-12 NOTE — Telephone Encounter (Signed)
Contacted patient with Dr. Hazeline Junker answer below - LVM with information and encouraged patient to contact office for further questions.

## 2020-10-03 ENCOUNTER — Telehealth: Payer: Self-pay

## 2020-10-03 NOTE — Telephone Encounter (Signed)
Pt given results. Verbalized thanks and understanding.

## 2020-10-03 NOTE — Telephone Encounter (Signed)
-----   Message from Wyatt Portela, MD sent at 10/03/2020  3:35 PM EDT ----- Please let er know MRI showed no cancer. There is a note in the chart few weeks ago when Linus Orn reached out to tell her the same.  Thanks ----- Message ----- From: Lennie Odor, LPN Sent: [id number]   3:33 PM EDT To: Wyatt Portela, MD  Pt called looking for results. Please advise.

## 2020-10-03 NOTE — Telephone Encounter (Signed)
Pt called to inquire about MRI results from 08/26/20. Routed message to Dr Alen Blew. Pt understands we will be back in touch with her and verbalizes thanks.

## 2020-10-18 ENCOUNTER — Telehealth: Payer: Self-pay | Admitting: Oncology

## 2020-10-18 NOTE — Telephone Encounter (Signed)
Spoke to [facility] and advised her that Dr. Berline Lopes is recommending trying an oral progesterone as well with the IUD for a few months.  Discussed side effects of increased appetite and risk for blood clots.  She said she would like to pass at this time.  She is going to give it until the end of this month but is leaning towards having the IUD removed.  She will call back if she does decide to have it removed.

## 2020-10-18 NOTE — Telephone Encounter (Signed)
Left a message requesting a return call to discuss IUD/bleeding.

## 2020-10-25 NOTE — Telephone Encounter (Signed)
Called Tamara Rhodes back and advised her that Dr. Berline Lopes would like to remove the IUD in the office if possible.  Scheduled appointment on 11/03/20 at 2:15.  She verbalized understanding and agreement of plan and appointment.

## 2020-10-25 NOTE — Telephone Encounter (Signed)
Tamara Rhodes called back and said she would like to have the IUD removed as soon as possible.  She continues to have vaginal bleeding.  She would like an appointment on a Thursday or Friday if possible.  She is also wondering if she will need to be "put to sleep" to remove it since she was for the placement and wants to avoid pain if possible.

## 2020-11-02 NOTE — Progress Notes (Unsigned)
Gynecologic Oncology Return Clinic Visit  11/03/20  Reason for Visit: Mirena IUD removal  Treatment History: 06/29/20: partial simple left vulvectomy (in the setting of VIN3), endometrial biopsy, Mirena IUD insertion for AUB Pathology of EMB revealed dysplasia squamous epithelium, benign LUS and endocervical tissue, no malignancy. P16 stain was positive (favored to be low grade changes). Pathology from vulvar resection revealed no dysplasia or malignancy, p16 negative.  Interval History:   Past Medical/Surgical History: Past Medical History:  Diagnosis Date   Anemia    Desmoid fibromatosis    Hypertension    Obesity    VIN III (vulvar intraepithelial neoplasia III)     Past Surgical History:  Procedure Laterality Date   CESAREAN SECTION     pt had had two   DILATION AND CURETTAGE OF UTERUS N/A 06/29/2020   Procedure: POSSIBLE DILATATION AND CURETTAGE;  Surgeon: Lafonda Mosses, MD;  Location: [facility];  Service: Gynecology;  Laterality: N/A;   INTRAUTERINE DEVICE (IUD) INSERTION N/A 06/29/2020   Procedure: INTRAUTERINE DEVICE (IUD) INSERTION MIRENA;  Surgeon: Lafonda Mosses, MD;  Location: [facility];  Service: Gynecology;  Laterality: N/A;   SOFT TISSUE TUMOR RESECTION  2014   from back   TUBAL LIGATION     16 years ago with c section   VULVECTOMY N/A 06/29/2020   Procedure: WIDE EXCISION VULVECTOMY WITH POSSIBLE ENDOMETRIAL BIOPSY;  Surgeon: Lafonda Mosses, MD;  Location: [facility];  Service: Gynecology;  Laterality: N/A;    Family History  Problem Relation Age of Onset   Osteoarthritis Mother    Hypertension Sister    Diabetes Father    Hypertension Father    Renal Disease Father    Cancer - Other Maternal Grandfather        Throat Cancer   Cancer - Prostate Maternal Uncle    Breast cancer Neg Hx    Endometrial cancer Neg Hx    Pancreatic cancer Neg Hx    Colon cancer Neg Hx     Social History   Socioeconomic History   Marital status: Single    Spouse  name: Not on file   Number of children: Not on file   Years of education: Not on file   Highest education level: Not on file  Occupational History   Occupation: admin clerk  Tobacco Use   Smoking status: Former    Pack years: 0.00    Types: Cigarettes    Quit date: 06/18/2020    Years since quitting: 0.3   Smokeless tobacco: Never   Tobacco comments:    was smoking black and milds  Vaping Use   Vaping Use: Some days  Substance and Sexual Activity   Alcohol use: Never   Drug use: Yes    Frequency: 2.0 times per week    Types: Marijuana   Sexual activity: Yes    Partners: Male    Birth control/protection: Surgical  Other Topics Concern   Not on file  Social History Narrative   Not on file   Social Determinants of Health   Financial Resource Strain: Not on file  Food Insecurity: No Food Insecurity   Worried About Running Out of Food in the Last Year: Never true   Ran Out of Food in the Last Year: Never true  Transportation Needs: No Transportation Needs   Lack of Transportation (Medical): No   Lack of Transportation (Non-Medical): No  Physical Activity: Not on file  Stress: Not on file  Social Connections: Not on file  Current Medications:  Current Outpatient Medications:    acetaminophen (TYLENOL) 500 MG tablet, Take 1,000 mg by mouth every 6 (six) hours as needed for moderate pain or headache., Disp: , Rfl:    amLODipine (NORVASC) 5 MG tablet, Take 1 tablet by mouth daily., Disp: , Rfl:    hydrOXYzine (ATARAX/VISTARIL) 25 MG tablet, Take 25 mg by mouth at bedtime as needed (sleep)., Disp: , Rfl:    ibuprofen (ADVIL) 800 MG tablet, Take 1 tablet (800 mg total) by mouth every 8 (eight) hours as needed for moderate pain. For AFTER surgery, Disp: 30 tablet, Rfl: 0   oxyCODONE (OXY IR/ROXICODONE) 5 MG immediate release tablet, Take 1 tablet (5 mg total) by mouth every 4 (four) hours as needed for severe pain. For AFTER surgery, do not take and drive, Disp: 15 tablet, Rfl:  0   senna-docusate (SENOKOT-S) 8.6-50 MG tablet, Take 2 tablets by mouth at bedtime. For AFTER surgery, do not take if having diarrhea (Patient not taking: Reported on 08/24/2020), Disp: 30 tablet, Rfl: 0   tranexamic acid (LYSTEDA) 650 MG TABS tablet, Take 2 tablets (1,300 mg total) by mouth 3 (three) times daily. Take during menses for a maximum of five days (Patient not taking: Reported on 08/24/2020), Disp: 30 tablet, Rfl: 4  Review of Systems: Denies appetite changes, fevers, chills, fatigue, unexplained weight changes. Denies hearing loss, neck lumps or masses, mouth sores, ringing in ears or voice changes. Denies cough or wheezing.  Denies shortness of breath. Denies chest pain or palpitations. Denies leg swelling. Denies abdominal distention, pain, blood in stools, constipation, diarrhea, nausea, vomiting, or early satiety. Denies pain with intercourse, dysuria, frequency, hematuria or incontinence. Denies hot flashes, pelvic pain, vaginal bleeding or vaginal discharge.   Denies joint pain, back pain or muscle pain/cramps. Denies itching, rash, or wounds. Denies dizziness, headaches, numbness or seizures. Denies swollen lymph nodes or glands, denies easy bruising or bleeding. Denies anxiety, depression, confusion, or decreased concentration.  Physical Exam: There were no vitals taken for this visit. General: ***Alert, oriented, no acute distress. HEENT: ***Posterior oropharynx clear, sclera anicteric. Chest: ***Clear to auscultation bilaterally.  ***Port site clean. Cardiovascular: ***Regular rate and rhythm, no murmurs. Abdomen: ***Obese, soft, nontender.  Normoactive bowel sounds.  No masses or hepatosplenomegaly appreciated.  ***Well-healed scar. Extremities: ***Grossly normal range of motion.  Warm, well perfused.  No edema bilaterally. Skin: ***No rashes or lesions noted. Lymphatics: ***No cervical, supraclavicular, or inguinal adenopathy. GU: Normal appearing external  genitalia without erythema, excoriation, or lesions.  Speculum exam reveals ***.  Bimanual exam reveals ***.  ***Rectovaginal exam  confirms ___.  Laboratory & Radiologic Studies:   Assessment & Plan: Tamara Rhodes is a 40 y.o. 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 Mirena IUD placed at the time of recent surgery for AUB, patient with significant symptoms and presenting today for removal.  ***  Given EMB pathology, I recommend that she have cotesting in a year (may have to be a blind pap).  *** minutes of total time was spent for this patient encounter, including preparation, face-to-face counseling with the patient and coordination of care, and documentation of the encounter.  Jeral Pinch, MD  Division of Gynecologic Oncology  Department of Obstetrics and Gynecology  [facility]

## 2020-11-03 ENCOUNTER — Ambulatory Visit: Payer: Medicaid Other | Admitting: Gynecologic Oncology

## 2020-11-23 NOTE — Progress Notes (Unsigned)
Gynecologic Oncology Return Clinic Visit  11/24/2020  Reason for Visit: Mirena IUD removal  Treatment History: 06/29/20: partial simple left vulvectomy (in the setting of VIN3), endometrial biopsy, Mirena IUD insertion for AUB Pathology of EMB revealed dysplasia squamous epithelium, benign LUS and endocervical tissue, no malignancy. P16 stain was positive (favored to be low grade changes). Pathology from vulvar resection revealed no dysplasia or malignancy, p16 negative.  Interval History: ***  Past Medical/Surgical History: Past Medical History:  Diagnosis Date   Anemia    Desmoid fibromatosis    Hypertension    Obesity    VIN III (vulvar intraepithelial neoplasia III)     Past Surgical History:  Procedure Laterality Date   CESAREAN SECTION     pt had had two   DILATION AND CURETTAGE OF UTERUS N/A 06/29/2020   Procedure: POSSIBLE DILATATION AND CURETTAGE;  Surgeon: Lafonda Mosses, MD;  Location: [facility];  Service: Gynecology;  Laterality: N/A;   INTRAUTERINE DEVICE (IUD) INSERTION N/A 06/29/2020   Procedure: INTRAUTERINE DEVICE (IUD) INSERTION MIRENA;  Surgeon: Lafonda Mosses, MD;  Location: [facility];  Service: Gynecology;  Laterality: N/A;   SOFT TISSUE TUMOR RESECTION  2014   from back   TUBAL LIGATION     16 years ago with c section   VULVECTOMY N/A 06/29/2020   Procedure: WIDE EXCISION VULVECTOMY WITH POSSIBLE ENDOMETRIAL BIOPSY;  Surgeon: Lafonda Mosses, MD;  Location: [facility];  Service: Gynecology;  Laterality: N/A;    Family History  Problem Relation Age of Onset   Osteoarthritis Mother    Hypertension Sister    Diabetes Father    Hypertension Father    Renal Disease Father    Cancer - Other Maternal Grandfather        Throat Cancer   Cancer - Prostate Maternal Uncle    Breast cancer Neg Hx    Endometrial cancer Neg Hx    Pancreatic cancer Neg Hx    Colon cancer Neg Hx     Social History   Socioeconomic History   Marital status: Single     Spouse name: Not on file   Number of children: Not on file   Years of education: Not on file   Highest education level: Not on file  Occupational History   Occupation: admin clerk  Tobacco Use   Smoking status: Former    Types: Cigarettes    Quit date: 06/18/2020    Years since quitting: 0.4   Smokeless tobacco: Never   Tobacco comments:    was smoking black and milds  Vaping Use   Vaping Use: Some days  Substance and Sexual Activity   Alcohol use: Never   Drug use: Yes    Frequency: 2.0 times per week    Types: Marijuana   Sexual activity: Yes    Partners: Male    Birth control/protection: Surgical  Other Topics Concern   Not on file  Social History Narrative   Not on file   Social Determinants of Health   Financial Resource Strain: Not on file  Food Insecurity: No Food Insecurity   Worried About Running Out of Food in the Last Year: Never true   Ran Out of Food in the Last Year: Never true  Transportation Needs: No Transportation Needs   Lack of Transportation (Medical): No   Lack of Transportation (Non-Medical): No  Physical Activity: Not on file  Stress: Not on file  Social Connections: Not on file    Current Medications:  Current  Outpatient Medications:    acetaminophen (TYLENOL) 500 MG tablet, Take 1,000 mg by mouth every 6 (six) hours as needed for moderate pain or headache., Disp: , Rfl:    amLODipine (NORVASC) 5 MG tablet, Take 1 tablet by mouth daily., Disp: , Rfl:    hydrOXYzine (ATARAX/VISTARIL) 25 MG tablet, Take 25 mg by mouth at bedtime as needed (sleep)., Disp: , Rfl:    ibuprofen (ADVIL) 800 MG tablet, Take 1 tablet (800 mg total) by mouth every 8 (eight) hours as needed for moderate pain. For AFTER surgery, Disp: 30 tablet, Rfl: 0   oxyCODONE (OXY IR/ROXICODONE) 5 MG immediate release tablet, Take 1 tablet (5 mg total) by mouth every 4 (four) hours as needed for severe pain. For AFTER surgery, do not take and drive, Disp: 15 tablet, Rfl: 0    senna-docusate (SENOKOT-S) 8.6-50 MG tablet, Take 2 tablets by mouth at bedtime. For AFTER surgery, do not take if having diarrhea (Patient not taking: Reported on 08/24/2020), Disp: 30 tablet, Rfl: 0   tranexamic acid (LYSTEDA) 650 MG TABS tablet, Take 2 tablets (1,300 mg total) by mouth 3 (three) times daily. Take during menses for a maximum of five days (Patient not taking: Reported on 08/24/2020), Disp: 30 tablet, Rfl: 4  Review of Systems: Denies appetite changes, fevers, chills, fatigue, unexplained weight changes. Denies hearing loss, neck lumps or masses, mouth sores, ringing in ears or voice changes. Denies cough or wheezing.  Denies shortness of breath. Denies chest pain or palpitations. Denies leg swelling. Denies abdominal distention, pain, blood in stools, constipation, diarrhea, nausea, vomiting, or early satiety. Denies pain with intercourse, dysuria, frequency, hematuria or incontinence. Denies hot flashes, pelvic pain, vaginal bleeding or vaginal discharge.   Denies joint pain, back pain or muscle pain/cramps. Denies itching, rash, or wounds. Denies dizziness, headaches, numbness or seizures. Denies swollen lymph nodes or glands, denies easy bruising or bleeding. Denies anxiety, depression, confusion, or decreased concentration.  Physical Exam: There were no vitals taken for this visit. General: ***Alert, oriented, no acute distress. HEENT: ***Posterior oropharynx clear, sclera anicteric. Chest: ***Clear to auscultation bilaterally.  ***Port site clean. Cardiovascular: ***Regular rate and rhythm, no murmurs. Abdomen: ***Obese, soft, nontender.  Normoactive bowel sounds.  No masses or hepatosplenomegaly appreciated.  ***Well-healed scar. Extremities: ***Grossly normal range of motion.  Warm, well perfused.  No edema bilaterally. Skin: ***No rashes or lesions noted. Lymphatics: ***No cervical, supraclavicular, or inguinal adenopathy. GU: Normal appearing external genitalia  without erythema, excoriation, or lesions.  Speculum exam reveals ***.  Bimanual exam reveals ***.  ***Rectovaginal exam  confirms ___.  Laboratory & Radiologic Studies: ***  Assessment & Plan: Tamara Rhodes is a 40 y.o. 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 Mirena IUD placed at the time of recent surgery for AUB, patient with significant symptoms and presenting today for removal.   ***   Given EMB pathology, I recommend that she have cotesting in a year (may have to be a blind pap).  *** minutes of total time was spent for this patient encounter, including preparation, face-to-face counseling with the patient and coordination of care, and documentation of the encounter.  Jeral Pinch, MD  Division of Gynecologic Oncology  Department of Obstetrics and Gynecology  [facility]

## 2020-11-24 ENCOUNTER — Inpatient Hospital Stay: Payer: Medicaid Other | Admitting: Gynecologic Oncology

## 2020-12-01 ENCOUNTER — Inpatient Hospital Stay: Payer: Medicaid Other | Attending: Gynecologic Oncology | Admitting: Gynecologic Oncology

## 2020-12-01 ENCOUNTER — Encounter: Payer: Self-pay | Admitting: Gynecologic Oncology

## 2020-12-01 ENCOUNTER — Other Ambulatory Visit: Payer: Self-pay

## 2020-12-01 VITALS — BP 165/123 | HR 95 | Resp 20 | Wt 275.0 lb

## 2020-12-01 DIAGNOSIS — Z87891 Personal history of nicotine dependence: Secondary | ICD-10-CM | POA: Insufficient documentation

## 2020-12-01 DIAGNOSIS — E669 Obesity, unspecified: Secondary | ICD-10-CM | POA: Diagnosis not present

## 2020-12-01 DIAGNOSIS — Z9079 Acquired absence of other genital organ(s): Secondary | ICD-10-CM | POA: Insufficient documentation

## 2020-12-01 DIAGNOSIS — I10 Essential (primary) hypertension: Secondary | ICD-10-CM | POA: Diagnosis not present

## 2020-12-01 DIAGNOSIS — N939 Abnormal uterine and vaginal bleeding, unspecified: Secondary | ICD-10-CM | POA: Insufficient documentation

## 2020-12-01 DIAGNOSIS — Z30432 Encounter for removal of intrauterine contraceptive device: Secondary | ICD-10-CM | POA: Diagnosis present

## 2020-12-01 DIAGNOSIS — Z79899 Other long term (current) drug therapy: Secondary | ICD-10-CM | POA: Insufficient documentation

## 2020-12-01 DIAGNOSIS — D071 Carcinoma in situ of vulva: Secondary | ICD-10-CM | POA: Insufficient documentation

## 2020-12-01 NOTE — Patient Instructions (Signed)
It was great to see you!  Please let me know if there are continued bleeding issues after removing the IUD today.  Your vulvar incision has healed completely.  I do not see any spots concerning for precancer on your vulva.  As we had previously discussed, I recommend vulvar examinations every 6 months due to your high-grade precancer and a repeat Pap test with HPV testing 1 year after your surgery with me in February.  I think that you could see your gynecologist in either January or February for both of these tests/exams.

## 2020-12-01 NOTE — Progress Notes (Signed)
Gynecologic Oncology Return Clinic Visit  12/01/2020  Reason for Visit: IUD removal  Treatment History: 06/29/20: partial simple left vulvectomy (in the setting of VIN3), endometrial biopsy, Mirena IUD insertion for AUB  Interval History: Patient presents today for Mirena IUD removal.  She continues to have daily vaginal bleeding or spotting, unchanged since her surgery.  She denies any significant pelvic pain.  She endorses occasional cramping.  Past Medical/Surgical History: Past Medical History:  Diagnosis Date   Anemia    Desmoid fibromatosis    Hypertension    Obesity    VIN III (vulvar intraepithelial neoplasia III)     Past Surgical History:  Procedure Laterality Date   CESAREAN SECTION     pt had had two   DILATION AND CURETTAGE OF UTERUS N/A 06/29/2020   Procedure: POSSIBLE DILATATION AND CURETTAGE;  Surgeon: Lafonda Mosses, MD;  Location: [facility];  Service: Gynecology;  Laterality: N/A;   INTRAUTERINE DEVICE (IUD) INSERTION N/A 06/29/2020   Procedure: INTRAUTERINE DEVICE (IUD) INSERTION MIRENA;  Surgeon: Lafonda Mosses, MD;  Location: [facility];  Service: Gynecology;  Laterality: N/A;   SOFT TISSUE TUMOR RESECTION  2014   from back   TUBAL LIGATION     16 years ago with c section   VULVECTOMY N/A 06/29/2020   Procedure: WIDE EXCISION VULVECTOMY WITH POSSIBLE ENDOMETRIAL BIOPSY;  Surgeon: Lafonda Mosses, MD;  Location: [facility];  Service: Gynecology;  Laterality: N/A;    Family History  Problem Relation Age of Onset   Osteoarthritis Mother    Hypertension Sister    Diabetes Father    Hypertension Father    Renal Disease Father    Cancer - Other Maternal Grandfather        Throat Cancer   Cancer - Prostate Maternal Uncle    Breast cancer Neg Hx    Endometrial cancer Neg Hx    Pancreatic cancer Neg Hx    Colon cancer Neg Hx     Social History   Socioeconomic History   Marital status: Single    Spouse name: Not on file   Number of children: Not on  file   Years of education: Not on file   Highest education level: Not on file  Occupational History   Occupation: admin clerk  Tobacco Use   Smoking status: Former    Types: Cigarettes    Quit date: 06/18/2020    Years since quitting: 0.4   Smokeless tobacco: Never   Tobacco comments:    was smoking black and milds  Vaping Use   Vaping Use: Some days  Substance and Sexual Activity   Alcohol use: Never   Drug use: Yes    Frequency: 2.0 times per week    Types: Marijuana   Sexual activity: Yes    Partners: Male    Birth control/protection: Surgical  Other Topics Concern   Not on file  Social History Narrative   Not on file   Social Determinants of Health   Financial Resource Strain: Not on file  Food Insecurity: No Food Insecurity   Worried About Running Out of Food in the Last Year: Never true   Ran Out of Food in the Last Year: Never true  Transportation Needs: No Transportation Needs   Lack of Transportation (Medical): No   Lack of Transportation (Non-Medical): No  Physical Activity: Not on file  Stress: Not on file  Social Connections: Not on file    Current Medications:  Current Outpatient Medications:  acetaminophen (TYLENOL) 500 MG tablet, Take 1,000 mg by mouth every 6 (six) hours as needed for moderate pain or headache., Disp: , Rfl:    amLODipine (NORVASC) 5 MG tablet, Take 1 tablet by mouth daily., Disp: , Rfl:    hydrOXYzine (ATARAX/VISTARIL) 25 MG tablet, Take 25 mg by mouth at bedtime as needed (sleep)., Disp: , Rfl:    ibuprofen (ADVIL) 800 MG tablet, Take 1 tablet (800 mg total) by mouth every 8 (eight) hours as needed for moderate pain. For AFTER surgery, Disp: 30 tablet, Rfl: 0   oxyCODONE (OXY IR/ROXICODONE) 5 MG immediate release tablet, Take 1 tablet (5 mg total) by mouth every 4 (four) hours as needed for severe pain. For AFTER surgery, do not take and drive, Disp: 15 tablet, Rfl: 0   senna-docusate (SENOKOT-S) 8.6-50 MG tablet, Take 2 tablets by  mouth at bedtime. For AFTER surgery, do not take if having diarrhea (Patient not taking: Reported on 08/24/2020), Disp: 30 tablet, Rfl: 0   tranexamic acid (LYSTEDA) 650 MG TABS tablet, Take 2 tablets (1,300 mg total) by mouth 3 (three) times daily. Take during menses for a maximum of five days (Patient not taking: Reported on 08/24/2020), Disp: 30 tablet, Rfl: 4  Review of Systems: Denies appetite changes, fevers, chills, fatigue, unexplained weight changes. Denies hearing loss, neck lumps or masses, mouth sores, ringing in ears or voice changes. Denies cough or wheezing.  Denies shortness of breath. Denies chest pain or palpitations. Denies leg swelling. Denies abdominal distention, pain, blood in stools, constipation, diarrhea, nausea, vomiting, or early satiety. Denies pain with intercourse, dysuria, frequency, hematuria or incontinence. Denies hot flashes, pelvic pain or vaginal discharge.   Denies joint pain, back pain or muscle pain/cramps. Denies itching, rash, or wounds. Denies dizziness, headaches, numbness or seizures. Denies swollen lymph nodes or glands, denies easy bruising or bleeding. Denies anxiety, depression, confusion, or decreased concentration.  Physical Exam: BP (!) 165/123 (BP Location: Left Wrist, Patient Position: Sitting)   Pulse 95   Resp 20   Wt 275 lb (124.7 kg)   SpO2 99%   BMI 53.71 kg/m  General: Alert, oriented, no acute distress. HEENT: Normocephalic, atraumatic, sclera anicteric. Chest: Unlabored breathing on room air. GU: Normal appearing external genitalia without erythema, excoriation, or lesions.  On bimanual exam, cervix and strings appreciated.  IUD strings grasped and IUD removed manually.  IUD checked and intact.  Laboratory & Radiologic Studies: None new  Assessment & Plan: Tamara Rhodes is a 40 y.o. woman with vulvar dysplasia as well as abnormal uterine bleeding who is 5 months status post partial simple left vulvectomy as well as Mirena  IUD insertion and endometrial biopsy.  Given continued menstrual irregularities, patient desired Mirena IUD removal.  This was done without difficulty today.  Discussed again recommendations for follow-up.  In the setting of high-grade vulvar dysplasia, I recommend follow-up with her GYN every 6 months for 5 years with an exam.  Her exam was normal today, and that she can wait until January or February to see her GYN.  Given her Pap test at the time of surgery, I recommend cotesting a year after, which would be in February 2023.  The patient will call her GYN to get scheduled after the first of the year.  28 minutes of total time was spent for this patient encounter, including preparation, face-to-face counseling with the patient and coordination of care, and documentation of the encounter.  Jeral Pinch, MD  Division of Gynecologic Oncology  Department of Obstetrics and Gynecology  [facility]   

## 2021-08-07 ENCOUNTER — Inpatient Hospital Stay: Payer: Medicaid Other | Attending: Oncology | Admitting: Oncology

## 2021-08-09 ENCOUNTER — Telehealth: Payer: Self-pay | Admitting: Oncology

## 2021-08-09 NOTE — Telephone Encounter (Signed)
.  Called patient to schedule appointment per 3/28 inbasket, patient is aware of date and time.   ?

## 2021-08-28 IMAGING — MR MR LUMBAR SPINE WO/W CM
4 of 7 series · 27 of 48 positions shown · IV contrast (10 GADAVIST)
Comparison: None available.

EXAM:
MRI THORACIC AND LUMBAR SPINE WITHOUT AND WITH CONTRAST
TECHNIQUE: Multiplanar and multiecho pulse sequences of the thoracic and lumbar
spine were obtained without and with intravenous contrast.

CONTRAST:  10mL GADAVIST GADOBUTROL 1 MMOL/ML IV SOLN

[Series 5: T1 · sagittal · 4.0mm · 0.81mm/px · 3 of 15 slices shown (1 of 2)]
[im 1/15]
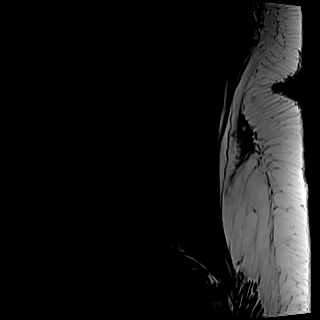
[im 8/15]
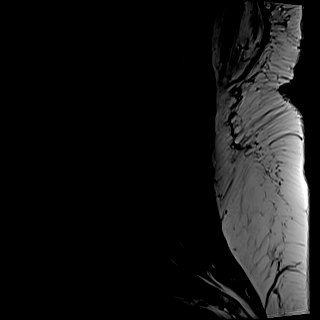
[im 15/15]
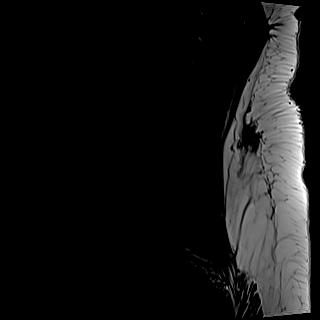

[Series 7: T2 · axial · 4.0mm · 0.69mm/px · z∈[-495,-266]mm · 11 of 40 slices shown]
[im 1/40]
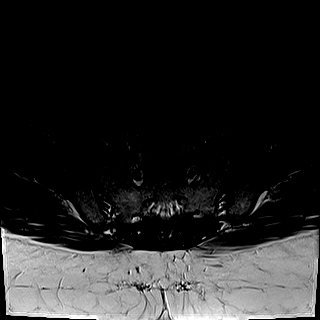
[im 4/40]
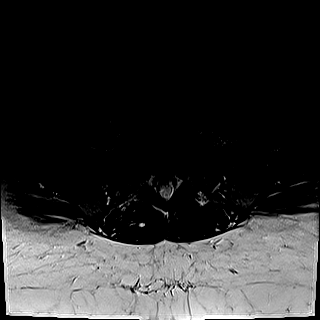
[im 8/40]
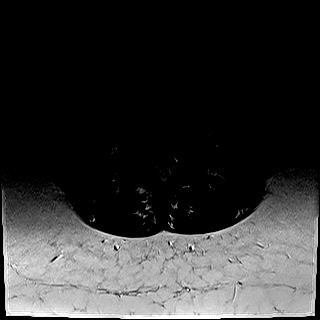
[im 12/40]
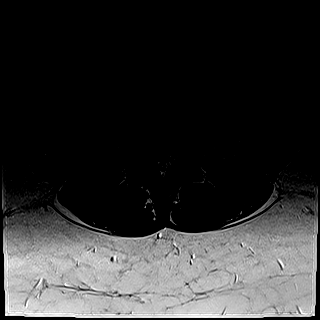
[im 16/40]
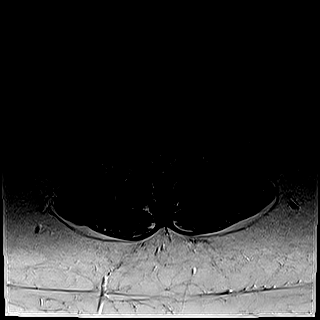
[im 20/40]
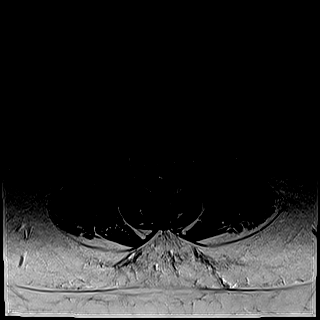
[im 24/40]
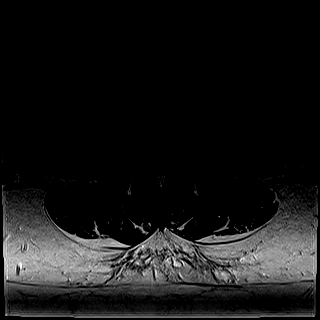
[im 28/40]
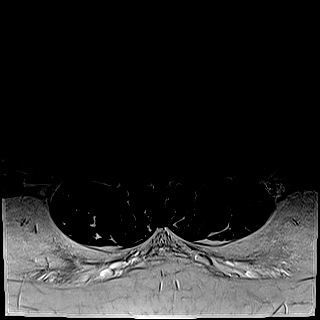
[im 32/40]
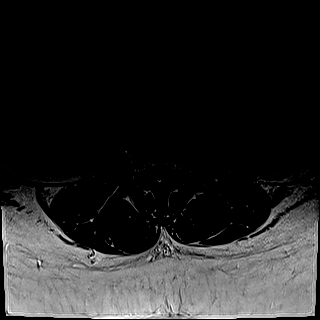
[im 36/40]
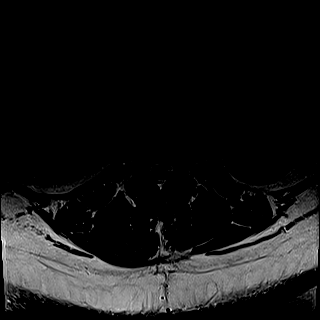
[im 40/40]
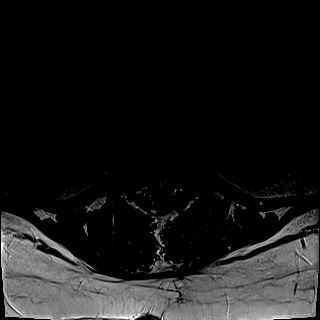

[Series 8: T1 · axial · 4.0mm · 0.43mm/px · z∈[-495,-286]mm · 9 of 40 slices shown (2 of 2)]
[im 1/40]
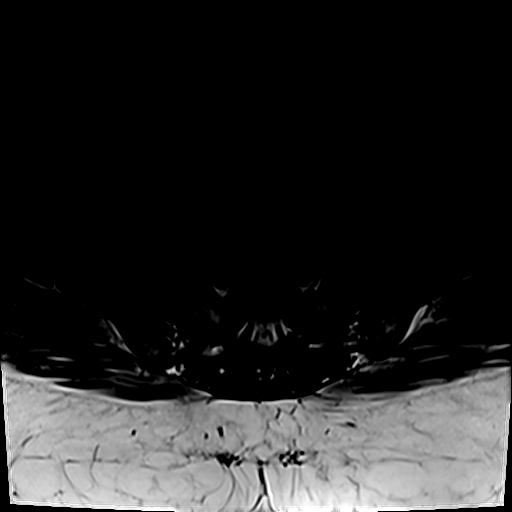
[im 4/40]
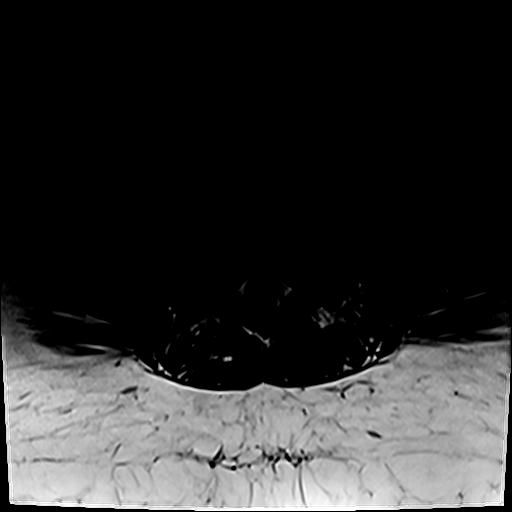
[im 8/40]
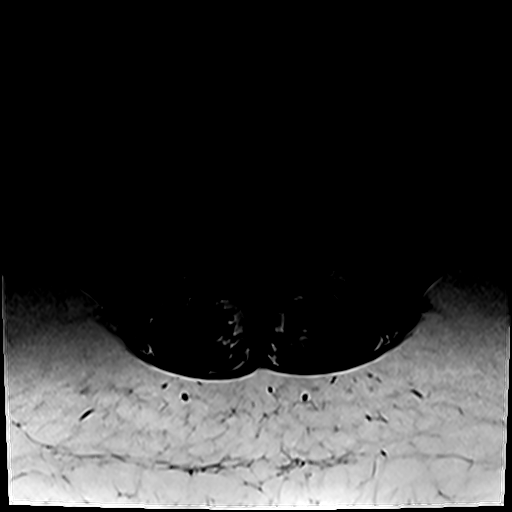
[im 12/40]
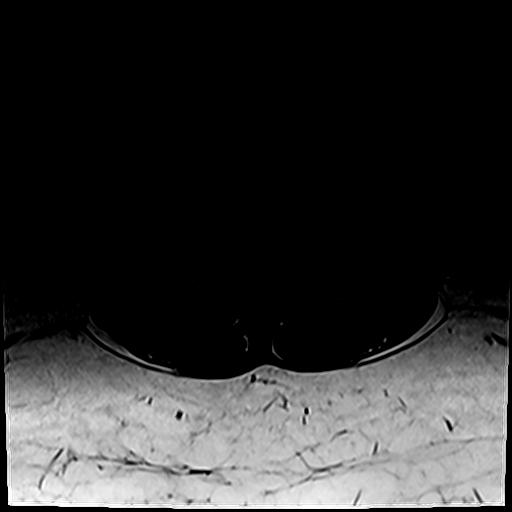
[im 16/40]
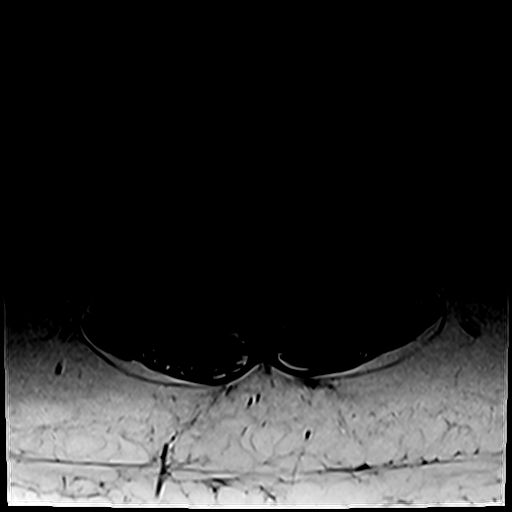
[im 20/40]
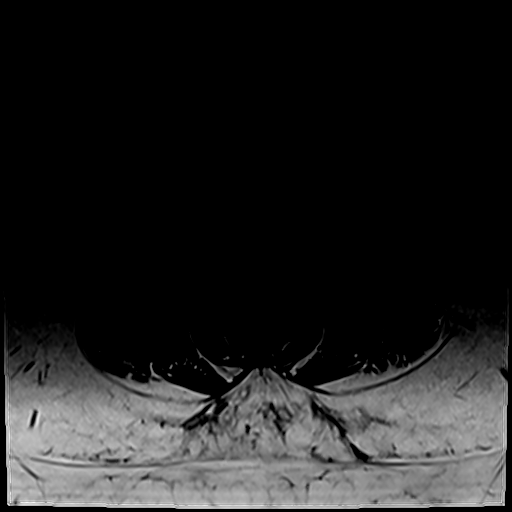
[im 24/40]
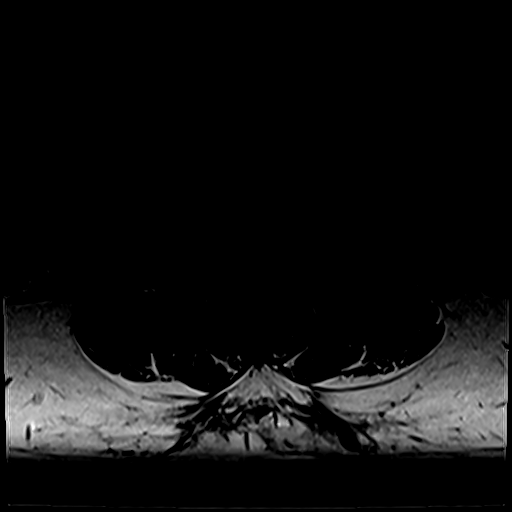
[im 28/40]
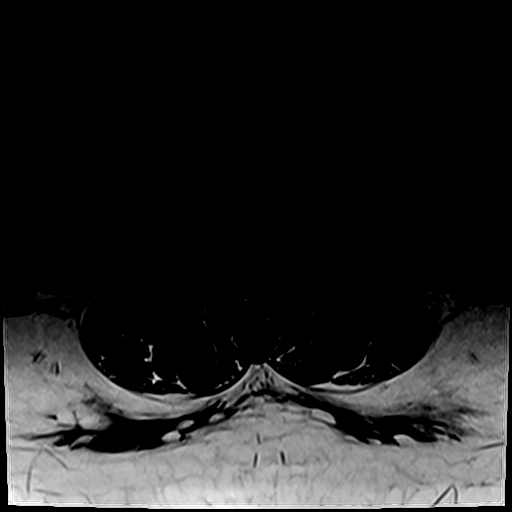
[im 36/40]
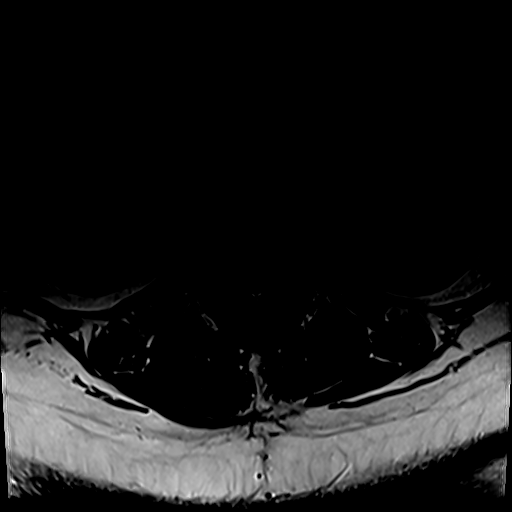

[Series 9: T2 post-contrast · sagittal · 4.0mm · 0.81mm/px · 4 of 15 slices shown]
[im 1/15]
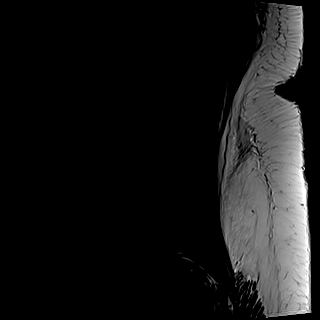
[im 5/15]
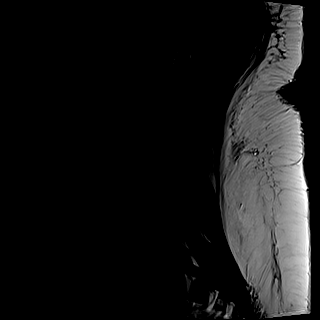
[im 10/15]
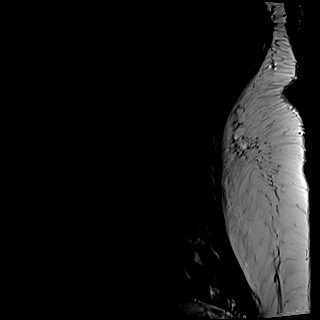
[im 15/15]
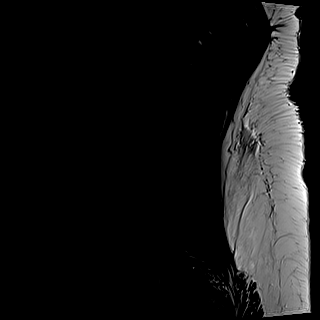

[27 of 48 positions shown; findings below may reference images not displayed]

FINDINGS: MRI THORACIC SPINE FINDINGS

Alignment: Mild dextroscoliosis. Alignment otherwise normal with
preservation of the normal thoracic kyphosis. No listhesis.

Vertebrae: Vertebral body height maintained without acute or chronic
fracture. Bone marrow signal intensity within normal limits. No
discrete or worrisome osseous lesions. No abnormal marrow edema or
enhancement.

Postoperative changes from prior tumor resection seen within the
lower posterior back along the midline, extending from approximately
the T9 level through L1. The T9 through L1 spinous processes have
been partially resected. Normal expected smooth postoperative
enhancement seen about the resection site. No significant irregular
or nodular enhancement to suggest locally recurrent tumor. No
visible evidence for recurrent tumor along the lower midline
incision. No other abnormal enhancement elsewhere within the
thoracic spine.

Cord:  Normal signal and morphology.  No abnormal enhancement.

Paraspinal and other soft tissues: Paraspinous soft tissues
otherwise unremarkable. Partially visualized lungs are grossly
clear. Visualized visceral structures unremarkable.

Disc levels:

T1-2:  Unremarkable.

T2-3: Unremarkable.

T3-4: Left paracentral disc protrusion mildly indents the left
ventral thecal sac. No significant canal or foraminal stenosis.

T4-5: Mild disc bulge. Mild facet hypertrophy, greater on the left.
No significant stenosis.

T5-6: Small central disc protrusion indents the ventral thecal sac.
Mild facet hypertrophy. No significant spinal stenosis. Foramina
remain patent.

T6-7: Small central disc protrusion indents the ventral thecal sac.
Left-sided facet degeneration. No significant spinal stenosis.
Foramina remain patent.

T7-8: Small central disc protrusion indents the ventral thecal sac.
Bilateral facet hypertrophy. No significant spinal stenosis.
Foramina remain patent.

T8-9: Right paracentral disc protrusion indents the ventral thecal
sac. Bilateral facet hypertrophy. No significant spinal stenosis.
Foramina remain patent.

T9-10: Negative interspace.  Mild facet hypertrophy.  No stenosis.

T10-11:  Negative interspace.  Mild facet hypertrophy.  No stenosis.

T11-12: Negative interspace. Bilateral facet hypertrophy. No
stenosis.

T12-L1:  Unremarkable.

MRI LUMBAR SPINE FINDINGS

Segmentation: Standard. Lowest well-formed disc space labeled the
L5-S1 level.

Alignment: Physiologic with preservation of the normal lumbar
lordosis. No listhesis.

Vertebrae: Vertebral body height maintained without acute or chronic
fracture. Bone marrow signal intensity within normal limits. No
discrete or worrisome osseous lesions. No abnormal marrow edema or
enhancement. No evidence for locally recurrent tumor within the
upper lumbar spine at site of prior tumor resection.

Conus medullaris: Extends to the L1 level and appears normal. No
abnormal enhancement.

Paraspinal and other soft tissues: Paraspinous soft tissues
otherwise unremarkable. Visualized visceral structures within normal
limits.

Disc levels:

L1-2:  Unremarkable.

L2-3: Minimal disc bulge. Mild facet hypertrophy. No significant
stenosis.

L3-4: Minimal disc bulge. Mild facet hypertrophy. No significant
stenosis.

L4-5: Normal interspace. Mild facet hypertrophy. No significant
stenosis.

L5-S1: Normal interspace. Moderate right with mild left facet
hypertrophy. No canal or foraminal stenosis.
IMPRESSION: 1. Postoperative changes from prior tumor resection extending from
T9 through L1 without evidence for recurrent tumor or metastatic
disease.
2. Mild multilevel degenerative disc disease and facet hypertrophy
throughout the thoracic and lumbar spine as above without
significant spinal stenosis or evidence for neural impingement.

## 2021-09-26 ENCOUNTER — Inpatient Hospital Stay: Payer: Medicaid Other | Admitting: Oncology

## 2021-10-02 ENCOUNTER — Inpatient Hospital Stay: Payer: Medicaid Other | Attending: Oncology | Admitting: Oncology

## 2021-10-02 ENCOUNTER — Other Ambulatory Visit: Payer: Self-pay

## 2021-10-02 VITALS — BP 158/90 | HR 86 | Temp 97.8°F | Resp 18 | Ht 60.0 in | Wt 245.7 lb

## 2021-10-02 DIAGNOSIS — Z87898 Personal history of other specified conditions: Secondary | ICD-10-CM | POA: Diagnosis not present

## 2021-10-02 DIAGNOSIS — D481 Neoplasm of uncertain behavior of connective and other soft tissue: Secondary | ICD-10-CM | POA: Diagnosis not present

## 2021-10-02 NOTE — Progress Notes (Signed)
Hematology and Oncology Follow Up Visit  Tamara Rhodes [id number] 02/11/1981 41 y.o. 10/02/2021 8:43 AM Patient, No Pcp Per (Inactive)No ref. provider found   Principle Diagnosis: 41 year old with desmoid fibromatosis diagnosed in 2014.   Prior Therapy: She is status post surgical resection removed by Dr. Nydia Rhodes in November 2014 at [facility].  The pathology showed desmoid fibromatosis.  She has no evidence of relapsed disease since that time.  Current therapy: Active surveillance.  Interim History: Tamara Rhodes returns today for a follow-up visit.  Since last visit, she underwent imaging studies of the lumbar and thoracic spine completed in April 2022 which showed no evidence of relapsed disease.  Clinically, she reports feeling well without any major complaints.  She denies any recent hospitalizations or illnesses.  She denies any back pain, shoulder pain or any neurological deficits.      Medications: I have reviewed the patient's current medications.  Current Outpatient Medications  Medication Sig Dispense Refill   acetaminophen (TYLENOL) 500 MG tablet Take 1,000 mg by mouth every 6 (six) hours as needed for moderate pain or headache.     amLODipine (NORVASC) 5 MG tablet Take 1 tablet by mouth daily.     hydrOXYzine (ATARAX/VISTARIL) 25 MG tablet Take 25 mg by mouth at bedtime as needed (sleep).     ibuprofen (ADVIL) 800 MG tablet Take 1 tablet (800 mg total) by mouth every 8 (eight) hours as needed for moderate pain. For AFTER surgery 30 tablet 0   oxyCODONE (OXY IR/ROXICODONE) 5 MG immediate release tablet Take 1 tablet (5 mg total) by mouth every 4 (four) hours as needed for severe pain. For AFTER surgery, do not take and drive 15 tablet 0   senna-docusate (SENOKOT-S) 8.6-50 MG tablet Take 2 tablets by mouth at bedtime. For AFTER surgery, do not take if having diarrhea (Patient not taking: Reported on 08/24/2020) 30 tablet 0   tranexamic acid (LYSTEDA) 650 MG TABS  tablet Take 2 tablets (1,300 mg total) by mouth 3 (three) times daily. Take during menses for a maximum of five days (Patient not taking: Reported on 08/24/2020) 30 tablet 4   No current facility-administered medications for this visit.     Allergies:  Allergies  Allergen Reactions   Penicillins Other (See Comments)    Yeast infection     Physical Exam: Blood pressure (!) 158/90, pulse 86, temperature 97.8 F (36.6 C), temperature source Temporal, resp. rate 18, height 5' (1.524 m), weight 245 lb 11.2 oz (111.4 kg), SpO2 100 %.  ECOG: 0    General appearance: Comfortable appearing without any discomfort Head: Normocephalic without any trauma Oropharynx: Mucous membranes are moist and pink without any thrush or ulcers. Eyes: Pupils are equal and round reactive to light. Lymph nodes: No cervical, supraclavicular, inguinal or axillary lymphadenopathy.   Heart:regular rate and rhythm.  S1 and S2 without leg edema. Lung: Clear without any rhonchi or wheezes.  No dullness to percussion. Abdomin: Soft, nontender, nondistended with good bowel sounds.  No hepatosplenomegaly. Musculoskeletal: No joint deformity or effusion.  Full range of motion noted. Neurological: No deficits noted on motor, sensory and deep tendon reflex exam. Skin: No masses or lesions.  Well-healed scar in the lumbar area.  No protrusions or masses.     Lab Results: Lab Results  Component Value Date   WBC 9.3 06/26/2020   HGB 10.5 (L) 06/26/2020   HCT 34.3 (L) 06/26/2020   MCV 81.7 06/26/2020   PLT 347 06/26/2020  Chemistry      Component Value Date/Time   NA 138 06/26/2020 0917   K 4.3 06/26/2020 0917   CL 106 06/26/2020 0917   CO2 24 06/26/2020 0917   BUN 11 06/26/2020 0917   CREATININE 0.72 06/26/2020 0917      Component Value Date/Time   CALCIUM 9.3 06/26/2020 0917         Impression and Plan:   41 year old woman with:   1.  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   He is currently on active surveillance without any evidence of relapsed disease approaching 10 years from her original surgery.  The natural course of this disease and risk of relapse was assessed.  At this time, I would recommend repeat imaging studies as needed-continue clinical surveillance.  Treatment options upon relapse include to surgical resection, radiation therapy and rarely systemic therapy.   2.  Follow-up: In 1 year for repeat follow-up and after that as needed.     30 minutes were spent on this encounter.  The time was dedicated to reviewing imaging studies, disease status update, treatment choices and outlining future plan of care review.     Tamara Button, MD 5/23/20238:43 AM

## 2021-12-19 ENCOUNTER — Telehealth: Payer: Self-pay | Admitting: Oncology

## 2021-12-19 NOTE — Telephone Encounter (Signed)
Called patient regarding upcoming Next year appointment, patient is notified.

## 2022-03-15 ENCOUNTER — Encounter: Payer: Self-pay | Admitting: Obstetrics and Gynecology

## 2022-03-15 ENCOUNTER — Other Ambulatory Visit (HOSPITAL_COMMUNITY)
Admission: RE | Admit: 2022-03-15 | Discharge: 2022-03-15 | Disposition: A | Discharge status: Home / Self Care | Payer: Medicaid Other | Source: Ambulatory Visit | Attending: Obstetrics and Gynecology | Admitting: Obstetrics and Gynecology

## 2022-03-15 ENCOUNTER — Other Ambulatory Visit: Payer: Self-pay

## 2022-03-15 ENCOUNTER — Ambulatory Visit (INDEPENDENT_AMBULATORY_CARE_PROVIDER_SITE_OTHER): Payer: Medicaid Other | Admitting: Obstetrics and Gynecology

## 2022-03-15 VITALS — BP 127/93 | HR 84 | Ht 60.0 in | Wt 220.8 lb

## 2022-03-15 DIAGNOSIS — Z113 Encounter for screening for infections with a predominantly sexual mode of transmission: Secondary | ICD-10-CM | POA: Insufficient documentation

## 2022-03-15 DIAGNOSIS — Z1239 Encounter for other screening for malignant neoplasm of breast: Secondary | ICD-10-CM | POA: Diagnosis not present

## 2022-03-15 DIAGNOSIS — Z01419 Encounter for gynecological examination (general) (routine) without abnormal findings: Secondary | ICD-10-CM

## 2022-03-15 NOTE — Progress Notes (Signed)
GYNECOLOGY ANNUAL PREVENTATIVE CARE ENCOUNTER NOTE  History:     Tamara Rhodes is a 41 y.o. G3P2 female here for a routine annual gynecologic exam.  Current complaints: none.   Denies abnormal vaginal bleeding, discharge, pelvic pain, problems with intercourse or other gynecologic concerns.    Gynecologic History Patient's last menstrual period was 03/08/2022 (exact date). Contraception: tubal ligation Last Pap: 1025/21. Results were: normal with negative HPV Last mammogram: mammogram appt scheduled  Obstetric History OB History  Gravida Para Term Preterm AB Living  '3 2       2  '$ SAB IAB Ectopic Multiple Live Births               # Outcome Date GA Lbr Len/2nd Weight Sex Delivery Anes PTL Lv  3 Gravida           2 Para           1 Para             Past Medical History:  Diagnosis Date   Anemia    Desmoid fibromatosis    Hypertension    Obesity    VIN III (vulvar intraepithelial neoplasia III)     Past Surgical History:  Procedure Laterality Date   CESAREAN SECTION     pt had had two   DILATION AND CURETTAGE OF UTERUS N/A 06/29/2020   Procedure: POSSIBLE DILATATION AND CURETTAGE;  Surgeon: Lafonda Mosses, MD;  Location: [facility];  Service: Gynecology;  Laterality: N/A;   INTRAUTERINE DEVICE (IUD) INSERTION N/A 06/29/2020   Procedure: INTRAUTERINE DEVICE (IUD) INSERTION MIRENA;  Surgeon: Lafonda Mosses, MD;  Location: [facility];  Service: Gynecology;  Laterality: N/A;   SOFT TISSUE TUMOR RESECTION  2014   from back   TUBAL LIGATION     16 years ago with c section   VULVECTOMY N/A 06/29/2020   Procedure: WIDE EXCISION VULVECTOMY WITH POSSIBLE ENDOMETRIAL BIOPSY;  Surgeon: Lafonda Mosses, MD;  Location: [facility];  Service: Gynecology;  Laterality: N/A;    Current Outpatient Medications on File Prior to Visit  Medication Sig Dispense Refill   amLODipine (NORVASC) 5 MG tablet Take 1 tablet by mouth daily.     ibuprofen (ADVIL) 800 MG tablet Take 1 tablet (800  mg total) by mouth every 8 (eight) hours as needed for moderate pain. For AFTER surgery 30 tablet 0   OZEMPIC, 1 MG/DOSE, 4 MG/3ML SOPN Inject 1 mg into the skin once a week.     rosuvastatin (CRESTOR) 20 MG tablet Take 1 tablet by mouth daily.     acetaminophen (TYLENOL) 500 MG tablet Take 1,000 mg by mouth every 6 (six) hours as needed for moderate pain or headache.     hydrOXYzine (ATARAX/VISTARIL) 25 MG tablet Take 25 mg by mouth at bedtime as needed (sleep).     oxyCODONE (OXY IR/ROXICODONE) 5 MG immediate release tablet Take 1 tablet (5 mg total) by mouth every 4 (four) hours as needed for severe pain. For AFTER surgery, do not take and drive 15 tablet 0   senna-docusate (SENOKOT-S) 8.6-50 MG tablet Take 2 tablets by mouth at bedtime. For AFTER surgery, do not take if having diarrhea (Patient not taking: Reported on 08/24/2020) 30 tablet 0   tranexamic acid (LYSTEDA) 650 MG TABS tablet Take 2 tablets (1,300 mg total) by mouth 3 (three) times daily. Take during menses for a maximum of five days (Patient not taking: Reported on 08/24/2020) 30 tablet 4   No  current facility-administered medications on file prior to visit.    Allergies  Allergen Reactions   Penicillins Other (See Comments)    Yeast infection    Social History:  reports that she quit smoking about 20 months ago. Her smoking use included cigarettes. She has never used smokeless tobacco. She reports current drug use. Frequency: 2.00 times per week. Drug: Marijuana. She reports that she does not drink alcohol.  Family History  Problem Relation Age of Onset   Osteoarthritis Mother    Hypertension Sister    Diabetes Father    Hypertension Father    Renal Disease Father    Cancer - Other Maternal Grandfather        Throat Cancer   Cancer - Prostate Maternal Uncle    Breast cancer Neg Hx    Endometrial cancer Neg Hx    Pancreatic cancer Neg Hx    Colon cancer Neg Hx     The following portions of the patient's history were  reviewed and updated as appropriate: allergies, current medications, past family history, past medical history, past social history, past surgical history and problem list.  Review of Systems Pertinent items noted in HPI and remainder of comprehensive ROS otherwise negative.  Physical Exam:  BP (!) 127/93   Pulse 84   Ht 5' (1.524 m)   Wt 220 lb 12.8 oz (100.2 kg)   LMP 03/08/2022 (Exact Date)   BMI 43.12 kg/m  CONSTITUTIONAL: Well-developed, well-nourished female in no acute distress.  HENT:  Normocephalic, atraumatic, External right and left ear normal. Oropharynx is clear and moist EYES: Conjunctivae and EOM are normal.  NECK: Normal range of motion, supple, no masses.  Normal thyroid.  SKIN: Skin is warm and dry. No rash noted. Not diaphoretic. No erythema. No pallor. MUSCULOSKELETAL: Normal range of motion. No tenderness.  No cyanosis, clubbing, or edema.  2+ distal pulses. NEUROLOGIC: Alert and oriented to person, place, and time. Normal reflexes, muscle tone coordination.  PSYCHIATRIC: Normal mood and affect. Normal behavior. Normal judgment and thought content. CARDIOVASCULAR: Normal heart rate noted, regular rhythm RESPIRATORY: Clear to auscultation bilaterally. Effort and breath sounds normal, no problems with respiration noted. BREASTS: deferred ABDOMEN: Soft, no distention noted.  No tenderness, rebound or guarding.  PELVIC: Normal appearing external genitalia and urethral meatus; previous vulvectomy noted, normal appearing vaginal mucosa and cervix.  No abnormal discharge noted.   Normal uterine size, no other palpable masses, no uterine or adnexal tenderness.  Performed in the presence of a chaperone.   Assessment and Plan:    1. Routine screening for STI (sexually transmitted infection) Per pt request  - Cervicovaginal ancillary only  2. Women's annual routine gynecological examination Normal annual exam today  3. Breast screening Mammogram scheduled  - MM  Digital Screening; Future  Will follow up results of pap smear and manage accordingly. Mammogram scheduled Routine preventative health maintenance measures emphasized. Please refer to After Visit Summary for other counseling recommendations.     F/u in 1 year Lynnda Shields, MD, [place], [facility] for [employer], [place]

## 2022-03-18 LAB — CERVICOVAGINAL ANCILLARY ONLY
Bacterial Vaginitis (gardnerella): POSITIVE — AB
Candida Glabrata: NEGATIVE
Candida Vaginitis: NEGATIVE
Chlamydia: NEGATIVE
Comment: NEGATIVE
Comment: NEGATIVE
Comment: NEGATIVE
Comment: NEGATIVE
Comment: NEGATIVE
Comment: NORMAL
Neisseria Gonorrhea: NEGATIVE
Trichomonas: POSITIVE — AB

## 2022-03-20 ENCOUNTER — Telehealth: Payer: Self-pay | Admitting: *Deleted

## 2022-03-20 MED ORDER — METRONIDAZOLE 500 MG PO TABS: 500.0000 mg | ORAL_TABLET | Freq: Two times a day (BID) | ORAL | 0 refills | Status: AC

## 2022-03-20 NOTE — Telephone Encounter (Addendum)
-----   Message from Griffin Basil, MD sent at 03/18/2022  6:27 PM EST ----- Dalbert Batman and BV noted on swab, offer treatment  11/8 1717  Called pt and she stated that she has reviewed the message from Dr. Elgie Congo regarding test results and treatment indicated. Rx sent to pharmacy. Pt advised to wait to have sex until one week after she and partner both have been treated.  She voiced understanding and had no questions.

## 2022-05-15 ENCOUNTER — Ambulatory Visit: Payer: Medicaid Other

## 2022-05-29 ENCOUNTER — Telehealth: Payer: Self-pay | Admitting: Oncology

## 2022-05-29 NOTE — Telephone Encounter (Signed)
Called patient per Dr. Alen Blew transition. Left voicemail for patient to call us back.

## 2022-07-04 ENCOUNTER — Ambulatory Visit
Admission: RE | Admit: 2022-07-04 | Discharge: 2022-07-04 | Disposition: A | Discharge status: Home / Self Care | Payer: Medicaid Other | Source: Ambulatory Visit | Attending: Obstetrics and Gynecology | Admitting: Obstetrics and Gynecology

## 2022-07-04 DIAGNOSIS — Z1239 Encounter for other screening for malignant neoplasm of breast: Secondary | ICD-10-CM

## 2022-10-03 ENCOUNTER — Ambulatory Visit: Payer: Medicaid Other | Admitting: Oncology
